# Patient Record
Sex: Male | Born: 1960 | Race: White | Hispanic: No | Marital: Married | Smoking: Never smoker
Health system: Southern US, Community
[De-identification: ages and names within clinical notes are randomized; demographics above are authoritative.]

## PROBLEM LIST (undated history)

## (undated) DIAGNOSIS — E119 Type 2 diabetes mellitus without complications: Secondary | ICD-10-CM

## (undated) DIAGNOSIS — M1611 Unilateral primary osteoarthritis, right hip: Secondary | ICD-10-CM

## (undated) DIAGNOSIS — N62 Hypertrophy of breast: Secondary | ICD-10-CM

## (undated) DIAGNOSIS — G8929 Other chronic pain: Secondary | ICD-10-CM

## (undated) DIAGNOSIS — I10 Essential (primary) hypertension: Secondary | ICD-10-CM

## (undated) DIAGNOSIS — M549 Dorsalgia, unspecified: Secondary | ICD-10-CM

## (undated) DIAGNOSIS — G4730 Sleep apnea, unspecified: Secondary | ICD-10-CM

## (undated) HISTORY — DX: Type 2 diabetes mellitus without complications: E11.9

## (undated) HISTORY — DX: Sleep apnea, unspecified: G47.30

## (undated) HISTORY — DX: Dorsalgia, unspecified: M54.9

## (undated) HISTORY — DX: Essential (primary) hypertension: I10

## (undated) HISTORY — DX: Hypertrophy of breast: N62

## (undated) HISTORY — DX: Other chronic pain: G89.29

---

## 2000-02-16 ENCOUNTER — Encounter: Payer: Self-pay | Admitting: Surgery

## 2000-02-16 ENCOUNTER — Encounter: Admission: RE | Admit: 2000-02-16 | Discharge: 2000-02-16 | Payer: Self-pay | Admitting: Surgery

## 2001-09-18 ENCOUNTER — Encounter: Admission: RE | Admit: 2001-09-18 | Discharge: 2001-09-18 | Payer: Self-pay | Admitting: Family Medicine

## 2001-09-18 ENCOUNTER — Encounter: Payer: Self-pay | Admitting: Family Medicine

## 2011-03-24 ENCOUNTER — Encounter: Payer: Self-pay | Admitting: Family Medicine

## 2011-03-24 ENCOUNTER — Ambulatory Visit (INDEPENDENT_AMBULATORY_CARE_PROVIDER_SITE_OTHER): Payer: 59 | Admitting: Family Medicine

## 2011-03-24 DIAGNOSIS — N62 Hypertrophy of breast: Secondary | ICD-10-CM

## 2011-03-24 DIAGNOSIS — R109 Unspecified abdominal pain: Secondary | ICD-10-CM

## 2011-03-24 DIAGNOSIS — R1030 Lower abdominal pain, unspecified: Secondary | ICD-10-CM | POA: Insufficient documentation

## 2011-03-24 NOTE — Assessment & Plan Note (Signed)
No tunnel vision or alarming sx.  Likely related to weight, d/w pt ZO:XWRUEA loss.

## 2011-03-24 NOTE — Patient Instructions (Signed)
Let me know if you pain increases or if you notice other changes in the meantime.  I would schedule a physical for the fall and I'll look over your records when they arrive.  Take care.  Glad to see you today.

## 2011-03-24 NOTE — Progress Notes (Signed)
Leg pain for weeks now.  Working about 70-80 hours a week, heavy lifting with R groin/leg pain.  Now with mass in R groin.  R knee was swollen after a big dog ran into him. Swelling went down in interval.   Inc in gynecomastia.  Bilateral, inc in last 2 months.  H/o prev that was similar and self resolved with weight loss.  Inc as his weight increased- this is similar to prev.   Meds, vitals, and allergies reviewed.   ROS: See HPI.  Otherwise, noncontributory.  Nad ncat Mmm rrr Prominent nipples with inc in tissue mass deep to nipple ctab abd soft, not ttp Testes bilaterally descended without nodularity, tenderness or masses. No scrotal masses or lesions. No hernia noted. Small mass noted in proximal r thigh, medially, minimally ttp. No erythema or skin changes.

## 2011-03-24 NOTE — Assessment & Plan Note (Signed)
Possible groin strain vs lipoma. Unlikely to be hernia.  Activity as tolerated and fu prn.  He agrees.  Anatomy d/w pt.

## 2011-04-04 ENCOUNTER — Encounter: Payer: Self-pay | Admitting: Family Medicine

## 2011-04-04 DIAGNOSIS — G4733 Obstructive sleep apnea (adult) (pediatric): Secondary | ICD-10-CM | POA: Insufficient documentation

## 2011-04-04 DIAGNOSIS — K760 Fatty (change of) liver, not elsewhere classified: Secondary | ICD-10-CM | POA: Insufficient documentation

## 2011-04-04 DIAGNOSIS — Z9989 Dependence on other enabling machines and devices: Secondary | ICD-10-CM | POA: Insufficient documentation

## 2011-04-04 DIAGNOSIS — M810 Age-related osteoporosis without current pathological fracture: Secondary | ICD-10-CM | POA: Insufficient documentation

## 2013-01-14 ENCOUNTER — Ambulatory Visit (INDEPENDENT_AMBULATORY_CARE_PROVIDER_SITE_OTHER): Payer: 59 | Admitting: Family Medicine

## 2013-01-14 ENCOUNTER — Encounter: Payer: Self-pay | Admitting: Family Medicine

## 2013-01-14 VITALS — BP 152/96 | HR 86 | Temp 98.0°F | Wt 265.0 lb

## 2013-01-14 DIAGNOSIS — M25569 Pain in unspecified knee: Secondary | ICD-10-CM | POA: Insufficient documentation

## 2013-01-14 DIAGNOSIS — M25561 Pain in right knee: Secondary | ICD-10-CM

## 2013-01-14 DIAGNOSIS — K4090 Unilateral inguinal hernia, without obstruction or gangrene, not specified as recurrent: Secondary | ICD-10-CM

## 2013-01-14 DIAGNOSIS — R1030 Lower abdominal pain, unspecified: Secondary | ICD-10-CM

## 2013-01-14 DIAGNOSIS — R109 Unspecified abdominal pain: Secondary | ICD-10-CM

## 2013-01-14 MED ORDER — TRAMADOL HCL 50 MG PO TABS
50.0000 mg | ORAL_TABLET | Freq: Three times a day (TID) | ORAL | Status: DC | PRN
Start: 2013-01-14 — End: 2013-03-15

## 2013-01-14 NOTE — Assessment & Plan Note (Signed)
Now with likely RIH.  Refer to gen surery.

## 2013-01-14 NOTE — Patient Instructions (Addendum)
See Shirlee Limerick about your referral before you leave today. Take the tramadol as needed for pain.  It can make you drowsy.  Glad to see you.

## 2013-01-14 NOTE — Assessment & Plan Note (Signed)
Tramadol in meantime and refer to ortho.  We don't have capacity for weight bearing films here.  Will defer imaging.  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

## 2013-01-14 NOTE — Progress Notes (Signed)
Prev with R groin pain but this is worse recently.  I didn't know if he had a hernia prev when I checked him >1 yea ago.  Pain with walking now. Not painful sitting or laying down.  No FCNAVD.  No abd pain.  No changes in stools.  No burning with urination. No new injury recently.    R knee pain continues.  Medial pain.  Crepitus noted by patient.  Pain walking.  Prev hit by a large dog >1 year ago.  Pain increased recently.   Meds, vitals, and allergies reviewed.   ROS: See HPI.  Otherwise, noncontributory.  nad Likely RIH noted, soft but ttp R knee with normal ROM but crepitus and medial joint line ttp ACL/MCL/LCL feel solid

## 2013-01-16 ENCOUNTER — Encounter (INDEPENDENT_AMBULATORY_CARE_PROVIDER_SITE_OTHER): Payer: Self-pay | Admitting: General Surgery

## 2013-01-16 ENCOUNTER — Ambulatory Visit (INDEPENDENT_AMBULATORY_CARE_PROVIDER_SITE_OTHER): Payer: BC Managed Care – PPO | Admitting: General Surgery

## 2013-01-16 VITALS — BP 138/94 | HR 89 | Temp 98.6°F | Ht 69.0 in | Wt 267.8 lb

## 2013-01-16 DIAGNOSIS — K4090 Unilateral inguinal hernia, without obstruction or gangrene, not specified as recurrent: Secondary | ICD-10-CM

## 2013-01-16 NOTE — Progress Notes (Signed)
Patient ID: Ivan Mcguire, male   DOB: 07-Apr-1961, 52 y.o.   MRN: [id number]  Chief Complaint  Patient presents with  . New Evaluation    eval RIH    HPI Ivan Mcguire is a 52 y.o. male.  This patient is referred by Dr. Para March for evaluation of right groin pain and an inguinal hernia. He says that he had some discomfort in his right groin and thigh for about one year now and was thought to have a lipoma in his upper leg because he felt some lumps in his groin. This seemed to improve but he has now noticed an another lump in his groin and he complains of "burning and pulling" pain in his right groin which radiates down to his legs. He says it always hurts when he first in the but that improves with more time. He is actually limping now. He does do a lot of lifting for his work. He says his bowels are normal and has no obstructive symptoms HPI  Past Medical History  Diagnosis Date  . HTN (hypertension)   . Sleep apnea   . Gynecomastia, male   . Chronic back pain     Past Surgical History  Procedure Laterality Date  . No past surgeries      denies surgical history    Family History  Problem Relation Age of Onset  . Angina Mother   . Stroke Father   . Dementia Father   . Diabetes Brother     Social History History  Substance Use Topics  . Smoking status: Never Smoker   . Smokeless tobacco: Not on file  . Alcohol Use: Yes     Comment: couple beers per month    No Known Allergies  Current Outpatient Prescriptions  Medication Sig Dispense Refill  . ibuprofen (ADVIL,MOTRIN) 200 MG tablet 800 mg every 8 (eight) hours as needed.        . traMADol (ULTRAM) 50 MG tablet Take 1 tablet (50 mg total) by mouth every 8 (eight) hours as needed for pain.  60 tablet  1   No current facility-administered medications for this visit.    Review of Systems Review of Systems All other review of systems negative or noncontributory except as stated in the HPI  Blood pressure 138/94,  pulse 89, temperature 98.6 F (37 C), temperature source Temporal, height 5\' 9"  (1.753 m), weight 267 lb 12.8 oz (121.473 kg), SpO2 97.00%.  Physical Exam Physical Exam Physical Exam  Vitals reviewed. Constitutional: He is oriented to person, place, and time. He appears well-developed and well-nourished. No distress.  HENT:  Head: Normocephalic and atraumatic.  Mouth/Throat: No oropharyngeal exudate.  Eyes: Conjunctivae and EOM are normal. Pupils are equal, round, and reactive to light. Right eye exhibits no discharge. Left eye exhibits no discharge. No scleral icterus.  Neck: Normal range of motion. No tracheal deviation present.  Cardiovascular: Normal rate, regular rhythm and normal heart sounds.   Pulmonary/Chest: Effort normal and breath sounds normal. No stridor. No respiratory distress. He has no wheezes. He has no rales. He exhibits no tenderness.  Abdominal: Soft. Bowel sounds are normal. He exhibits no distension and no mass. There is no tenderness. There is no rebound and no guarding. small and reducible inguinal hernias bilaterally Musculoskeletal: Normal range of motion. He exhibits no edema and no tenderness.  Neurological: He is alert and oriented to person, place, and time.  Skin: Skin is warm and dry. No rash noted. He is not diaphoretic. No  erythema. No pallor.  Psychiatric: He has a normal mood and affect. His behavior is normal. Judgment and thought content normal.    Data Reviewed   Assessment    Bilateral inguinal hernias-reducible I think that he does have small and reducible bilateral inguinal hernias but I am not certain that this is the cause of his hip discomfort and groin pain. He is scheduled to see the orthopedic doctor next week in think that this is appropriate. I did offer open or laparoscopic inguinal hernia repair and I would probably recommend microscopic bilateral inguinal hernia repair for this case, however, think it is appropriate to treat his knee  and see if his groin pain improves as this may all be musculoskeletal in origin and these hernias maybe incidental. He will go ahead and follow up with orthopedic surgery and he will followup with me after this 2 discuss possible microscopic bilateral inguinal hernia repairs    Plan    Orthopedic surgery referral and followup after this to discuss bilateral inguinal hernia repair        Ivan Mcguire DAVID 01/16/2013, 9:22 AM

## 2013-03-15 ENCOUNTER — Encounter (HOSPITAL_COMMUNITY): Payer: Self-pay | Admitting: Pharmacy Technician

## 2013-03-16 NOTE — Pre-Procedure Instructions (Signed)
Ivan Mcguire  03/16/2013   Your procedure is scheduled on:  Tues, June 3 @ 10:30 AM  Report to [facility] at 8:30 AM.  Call this number if you have problems the morning of surgery: [phone]   Remember:   Do not eat food or drink liquids after midnight.   Take these medicines the morning of surgery with A SIP OF WATER: Tamadol(Ultram) if needed               Stop taking your Ibuprofen. No Goody's,BC's,Aleve,Fish Oil,or any Herbal Medications   Do not wear jewelry.  Do not wear lotions, powders, or colognes. You may wear deodorant.  Men may shave face and neck.  Do not bring valuables to the hospital.  Contacts, dentures or bridgework may not be worn into surgery.  Leave suitcase in the car. After surgery it may be brought to your room.  For patients admitted to the hospital, checkout time is 11:00 AM the day of  discharge.   Patients discharged the day of surgery will not be allowed to drive  home.    Special Instructions: Shower using CHG 2 nights before surgery and the night before surgery.  If you shower the day of surgery use CHG.  Use special wash - you have one bottle of CHG for all showers.  You should use approximately 1/3 of the bottle for each shower.   Please read over the following fact sheets that you were given: Pain Booklet, Coughing and Deep Breathing, Blood Transfusion Information, Total Joint Packet, MRSA Information and Surgical Site Infection Prevention

## 2013-03-19 ENCOUNTER — Encounter (HOSPITAL_COMMUNITY)
Admission: RE | Admit: 2013-03-19 | Discharge: 2013-03-19 | Disposition: A | Discharge status: Home / Self Care | Payer: BC Managed Care – PPO | Source: Ambulatory Visit | Attending: Orthopedic Surgery | Admitting: Orthopedic Surgery

## 2013-03-19 ENCOUNTER — Other Ambulatory Visit: Payer: Self-pay | Admitting: Orthopedic Surgery

## 2013-03-19 ENCOUNTER — Encounter (HOSPITAL_COMMUNITY): Payer: Self-pay

## 2013-03-19 DIAGNOSIS — Z01812 Encounter for preprocedural laboratory examination: Secondary | ICD-10-CM | POA: Insufficient documentation

## 2013-03-19 DIAGNOSIS — Z01818 Encounter for other preprocedural examination: Secondary | ICD-10-CM | POA: Insufficient documentation

## 2013-03-19 DIAGNOSIS — Z01811 Encounter for preprocedural respiratory examination: Secondary | ICD-10-CM | POA: Insufficient documentation

## 2013-03-19 DIAGNOSIS — Z01810 Encounter for preprocedural cardiovascular examination: Secondary | ICD-10-CM | POA: Insufficient documentation

## 2013-03-19 LAB — CBC
Hemoglobin: 15.2 g/dL (ref 13.0–17.0)
MCH: 30.4 pg (ref 26.0–34.0)
MCV: 88.6 fL (ref 78.0–100.0)
Platelets: 198 10*3/uL (ref 150–400)
RBC: 5 MIL/uL (ref 4.22–5.81)

## 2013-03-19 LAB — BASIC METABOLIC PANEL
BUN: 13 mg/dL (ref 6–23)
Calcium: 9.5 mg/dL (ref 8.4–10.5)
Chloride: 107 mEq/L (ref 96–112)
Creatinine, Ser: 0.73 mg/dL (ref 0.50–1.35)
GFR calc Af Amer: >90 mL/min (ref >90)

## 2013-03-19 LAB — ABO/RH: ABO/RH(D): O POS

## 2013-03-19 LAB — TYPE AND SCREEN
ABO/RH(D): O POS
Antibody Screen: NEGATIVE

## 2013-03-19 NOTE — Progress Notes (Signed)
Pt to see Dr Charna Elizabeth office to day for cardiac clearance.

## 2013-03-20 ENCOUNTER — Ambulatory Visit (INDEPENDENT_AMBULATORY_CARE_PROVIDER_SITE_OTHER): Payer: BC Managed Care – PPO | Admitting: Family Medicine

## 2013-03-20 ENCOUNTER — Encounter: Payer: Self-pay | Admitting: Family Medicine

## 2013-03-20 VITALS — BP 142/82 | HR 96 | Temp 98.2°F | Wt 265.5 lb

## 2013-03-20 DIAGNOSIS — G4733 Obstructive sleep apnea (adult) (pediatric): Secondary | ICD-10-CM

## 2013-03-20 DIAGNOSIS — R05 Cough: Secondary | ICD-10-CM | POA: Insufficient documentation

## 2013-03-20 DIAGNOSIS — R059 Cough, unspecified: Secondary | ICD-10-CM | POA: Insufficient documentation

## 2013-03-20 DIAGNOSIS — Z01818 Encounter for other preprocedural examination: Secondary | ICD-10-CM | POA: Insufficient documentation

## 2013-03-20 MED ORDER — BENZONATATE 100 MG PO CAPS: 100.0000 mg | ORAL_CAPSULE | Freq: Two times a day (BID) | ORAL | Status: DC | PRN

## 2013-03-20 NOTE — Patient Instructions (Addendum)
Good to meet you today! Good luck on upcoming surgery. I will fax today's note to Dr. Dion Saucier. Next blood work consider getting cholesterol and sugar checked.

## 2013-03-20 NOTE — Assessment & Plan Note (Signed)
Mild, post viral. Treat with tessalon perls.  Lungs clear today.

## 2013-03-20 NOTE — Assessment & Plan Note (Signed)
Intermediate risk procedure.  Cardiac risk factors include obesity and OSA (which seems well controlled currently), however no significant cardiac or pulmonary symptoms endorsed. Recommend anticoagulation per ortho, recommend CPAP perioperatively. Hopeful to improve mobility to be able to establish exercise regimen after surgery.  Discussed risks vs benefits of upcoming surgery.  Discussed surgery not without its risks, but I expect him to do well. Will fax today's note to Dr. Dion Saucier. Reviewed all blood work, imaging and testing done yesterday at hospital.  No need for further eval.

## 2013-03-20 NOTE — Progress Notes (Signed)
Subjective:    Patient ID: Ivan Mcguire, male    DOB: 1961/05/01, 52 y.o.   MRN: [id number]  HPI CC: preop clearance  [name] is a pleasant 52 yo patient of Dr. Lianne Bushy new to me who presents today for preop clearance of upcoming R total hip replacement on 03/26/2013.  Noted right leg pain for years now, recent eval by ortho with R knee injection and revealing significant arthritis in right hip. 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  Active at work, lots of walking.  No other exercise.  Limited lower extremity cardio 2/2 hip pain.  Limps at work 2/2 R hip pain.  He did have preop EKG, CXR and blood yesterday at hospital work which was all reviewed. CXR - no active disease EKG - NSR rate 80, normal axis, intervals, no acute ST/T changes Blood work - normal CBC, BMP except for glu 144, PT/INR, MRSA screen negative.  Was not fasting prior to blood work (had coffee with sugar prior to blood work).  H/o OSA on CPAP and uses nightly.  Dx at age 44 yo.  Has old mask, would like script for now supplies.   H/o borderline HTN, off meds.  No known allergies to medicines. Only on tramadol 50mg  prn pain.  Lingering cough - from viral URI 3-4 wks ago.  Requests med for this.    Wt Readings from Last 3 Encounters:  03/20/13 265 lb 8 oz (120.43 kg)  03/19/13 267 lb 14.4 oz (121.519 kg)  01/16/13 267 lb 12.8 oz (121.473 kg)   Body mass index is 39.19 kg/(m^2).  Medications and allergies reviewed and updated in chart.  Past histories reviewed and updated if relevant as below. Patient Active Problem List   Diagnosis Date Noted  . Knee pain 01/14/2013  . OSA on CPAP 04/04/2011  . Osteoporosis 04/04/2011  . Fatty liver 04/04/2011  . Gynecomastia 03/24/2011  . Groin pain, right 03/24/2011   Past Medical History  Diagnosis Date  . Gynecomastia, male   . Chronic back pain   . Sleep apnea     cpap    last sleep study > 20 yrs  . HTN (hypertension)     no med  dr Desma Maxim    Past Surgical History  Procedure Laterality Date  . No past surgeries      denies surgical history   History  Substance Use Topics  . Smoking status: Never Smoker   . Smokeless tobacco: Not on file  . Alcohol Use: Yes     Comment: couple beers per month   Family History  Problem Relation Age of Onset  . Angina Mother   . Stroke Father   . Dementia Father   . Diabetes Brother    No Known Allergies Current Outpatient Prescriptions on File Prior to Visit  Medication Sig Dispense Refill  . traMADol (ULTRAM) 50 MG tablet Take 50 mg by mouth 2 (two) times daily as needed.       Marland Kitchen ibuprofen (ADVIL,MOTRIN) 200 MG tablet Take 800 mg by mouth every 6 (six) hours as needed for pain.       . Multiple Vitamin (MULTIVITAMIN WITH MINERALS) TABS Take 1 tablet by mouth daily.       No current facility-administered medications on file prior to visit.     Review of Systems No recent headaches, vision changes, dizziness, palpitations, chest pain/tightness, dyspnea at rest or with exertion, PNdyspnea, or leg swelling.  Occasional dependent ankle swelling after  long day at work.     Objective:   Physical Exam  Nursing note and vitals reviewed. Constitutional: He appears well-developed and well-nourished. No distress.  HENT:  Head: Normocephalic and atraumatic.  Mouth/Throat: Oropharynx is clear and moist.  Eyes: Conjunctivae and EOM are normal. Pupils are equal, round, and reactive to light.  Neck: Normal range of motion. Neck supple. Carotid bruit is not present. No thyromegaly present.  Cardiovascular: Normal rate, regular rhythm, normal heart sounds and intact distal pulses.   No murmur heard. Pulses:      Radial pulses are 2+ on the right side, and 2+ on the left side.  Pulmonary/Chest: Effort normal and breath sounds normal. No respiratory distress. He has no wheezes. He has no rales.  Abdominal: Soft. Bowel sounds are normal. He exhibits no distension and no mass. There is no  tenderness. There is no rebound and no guarding.  Musculoskeletal: Normal range of motion. He exhibits no edema.  Lymphadenopathy:    He has no cervical adenopathy.  Neurological: He is alert.  CN grossly intact, station and gait intact       Assessment & Plan:

## 2013-03-20 NOTE — Assessment & Plan Note (Signed)
Script for new CPAP mask/tubing/supplies provided today. Recommended CPAP perioperatively as he has been doing at home.

## 2013-03-25 MED ORDER — DEXTROSE 5 % IV SOLN
3.0000 g | INTRAVENOUS | Status: AC
  Administered 2013-03-26: 3 g via INTRAVENOUS
  Filled 2013-03-25: qty 3000

## 2013-03-26 ENCOUNTER — Encounter (HOSPITAL_COMMUNITY): Payer: Self-pay | Admitting: Anesthesiology

## 2013-03-26 ENCOUNTER — Encounter (HOSPITAL_COMMUNITY): Payer: Self-pay | Admitting: *Deleted

## 2013-03-26 ENCOUNTER — Inpatient Hospital Stay (HOSPITAL_COMMUNITY): Payer: BC Managed Care – PPO

## 2013-03-26 ENCOUNTER — Ambulatory Visit (HOSPITAL_COMMUNITY): Payer: BC Managed Care – PPO | Admitting: Anesthesiology

## 2013-03-26 ENCOUNTER — Encounter (HOSPITAL_COMMUNITY)
Admission: RE | Disposition: A | Discharge status: Home / Self Care | Payer: Self-pay | Source: Ambulatory Visit | Attending: Orthopedic Surgery

## 2013-03-26 ENCOUNTER — Inpatient Hospital Stay (HOSPITAL_COMMUNITY)
Admission: RE | Admit: 2013-03-26 | Discharge: 2013-03-28 | DRG: 818 | Disposition: A | Discharge status: Home / Self Care | Payer: BC Managed Care – PPO | Source: Ambulatory Visit | Attending: Orthopedic Surgery | Admitting: Orthopedic Surgery

## 2013-03-26 DIAGNOSIS — N62 Hypertrophy of breast: Secondary | ICD-10-CM | POA: Diagnosis present

## 2013-03-26 DIAGNOSIS — M169 Osteoarthritis of hip, unspecified: Principal | ICD-10-CM | POA: Diagnosis present

## 2013-03-26 DIAGNOSIS — I10 Essential (primary) hypertension: Secondary | ICD-10-CM | POA: Diagnosis present

## 2013-03-26 DIAGNOSIS — Z6839 Body mass index (BMI) 39.0-39.9, adult: Secondary | ICD-10-CM

## 2013-03-26 DIAGNOSIS — G4730 Sleep apnea, unspecified: Secondary | ICD-10-CM | POA: Diagnosis present

## 2013-03-26 DIAGNOSIS — M1610 Unilateral primary osteoarthritis, unspecified hip: Principal | ICD-10-CM | POA: Diagnosis present

## 2013-03-26 DIAGNOSIS — M1611 Unilateral primary osteoarthritis, right hip: Secondary | ICD-10-CM | POA: Diagnosis present

## 2013-03-26 DIAGNOSIS — M549 Dorsalgia, unspecified: Secondary | ICD-10-CM | POA: Diagnosis present

## 2013-03-26 DIAGNOSIS — G8929 Other chronic pain: Secondary | ICD-10-CM | POA: Diagnosis present

## 2013-03-26 HISTORY — PX: TOTAL HIP ARTHROPLASTY: SHX124

## 2013-03-26 HISTORY — DX: Unilateral primary osteoarthritis, right hip: M16.11

## 2013-03-26 LAB — BASIC METABOLIC PANEL
Calcium: 9 mg/dL (ref 8.4–10.5)
GFR calc Af Amer: >90 mL/min (ref >90)
GFR calc non Af Amer: >90 mL/min (ref >90)
Potassium: 3.8 mEq/L (ref 3.5–5.1)
Sodium: 138 mEq/L (ref 135–145)

## 2013-03-26 LAB — CBC
Hemoglobin: 12.8 g/dL — ABNORMAL LOW (ref 13.0–17.0)
MCHC: 34.8 g/dL (ref 30.0–36.0)
Platelets: 199 10*3/uL (ref 150–400)
RDW: 13.8 % (ref 11.5–15.5)

## 2013-03-26 SURGERY — ARTHROPLASTY, HIP, TOTAL,POSTERIOR APPROACH
Anesthesia: General | Site: Hip | Laterality: Right | Wound class: Clean

## 2013-03-26 MED ORDER — VECURONIUM BROMIDE 10 MG IV SOLR
INTRAVENOUS | Status: DC | PRN
  Administered 2013-03-26 (×3): 2 mg via INTRAVENOUS

## 2013-03-26 MED ORDER — BUPIVACAINE HCL (PF) 0.25 % IJ SOLN
INTRAMUSCULAR | Status: AC
  Filled 2013-03-26: qty 30

## 2013-03-26 MED ORDER — METOCLOPRAMIDE HCL 10 MG PO TABS: 5.0000 mg | ORAL_TABLET | Freq: Three times a day (TID) | ORAL | Status: DC | PRN

## 2013-03-26 MED ORDER — OXYCODONE-ACETAMINOPHEN 10-325 MG PO TABS: 1.0000 | ORAL_TABLET | Freq: Four times a day (QID) | ORAL | Status: DC | PRN

## 2013-03-26 MED ORDER — ONDANSETRON HCL 4 MG/2ML IJ SOLN
4.0000 mg | Freq: Four times a day (QID) | INTRAMUSCULAR | Status: DC | PRN
  Administered 2013-03-26 – 2013-03-27 (×2): 4 mg via INTRAVENOUS
  Filled 2013-03-26 (×2): qty 2

## 2013-03-26 MED ORDER — SENNA 8.6 MG PO TABS
1.0000 | ORAL_TABLET | Freq: Two times a day (BID) | ORAL | Status: DC
  Administered 2013-03-26 – 2013-03-28 (×4): 8.6 mg via ORAL
  Filled 2013-03-26 (×7): qty 1

## 2013-03-26 MED ORDER — SODIUM CHLORIDE 0.9 % IR SOLN
Status: DC | PRN
  Administered 2013-03-26: 1000 mL

## 2013-03-26 MED ORDER — MIDAZOLAM HCL 5 MG/5ML IJ SOLN
INTRAMUSCULAR | Status: DC | PRN
  Administered 2013-03-26: 1 mg via INTRAVENOUS

## 2013-03-26 MED ORDER — ADULT MULTIVITAMIN W/MINERALS CH
1.0000 | ORAL_TABLET | Freq: Every day | ORAL | Status: DC
  Administered 2013-03-27 – 2013-03-28 (×2): 1 via ORAL
  Filled 2013-03-26 (×3): qty 1

## 2013-03-26 MED ORDER — ONDANSETRON HCL 4 MG/2ML IJ SOLN: 4.0000 mg | Freq: Once | INTRAMUSCULAR | Status: DC | PRN

## 2013-03-26 MED ORDER — BUPIVACAINE HCL (PF) 0.25 % IJ SOLN
INTRAMUSCULAR | Status: DC | PRN
  Administered 2013-03-26: 30 mL

## 2013-03-26 MED ORDER — MENTHOL 3 MG MT LOZG: 1.0000 | LOZENGE | OROMUCOSAL | Status: DC | PRN

## 2013-03-26 MED ORDER — NEOSTIGMINE METHYLSULFATE 1 MG/ML IJ SOLN
INTRAMUSCULAR | Status: DC | PRN
  Administered 2013-03-26: 3 mg via INTRAVENOUS

## 2013-03-26 MED ORDER — METHOCARBAMOL 500 MG PO TABS: 500.0000 mg | ORAL_TABLET | Freq: Four times a day (QID) | ORAL | Status: DC

## 2013-03-26 MED ORDER — KETOROLAC TROMETHAMINE 15 MG/ML IJ SOLN
15.0000 mg | Freq: Four times a day (QID) | INTRAMUSCULAR | Status: AC
  Administered 2013-03-26 – 2013-03-27 (×3): 15 mg via INTRAVENOUS
  Filled 2013-03-26 (×3): qty 1

## 2013-03-26 MED ORDER — ACETAMINOPHEN 325 MG PO TABS: 650.0000 mg | ORAL_TABLET | Freq: Four times a day (QID) | ORAL | Status: DC | PRN

## 2013-03-26 MED ORDER — SENNA-DOCUSATE SODIUM 8.6-50 MG PO TABS: 2.0000 | ORAL_TABLET | Freq: Every day | ORAL | Status: DC

## 2013-03-26 MED ORDER — BENZONATATE 100 MG PO CAPS
100.0000 mg | ORAL_CAPSULE | Freq: Two times a day (BID) | ORAL | Status: DC | PRN
  Filled 2013-03-26: qty 1

## 2013-03-26 MED ORDER — IBUPROFEN 800 MG PO TABS
800.0000 mg | ORAL_TABLET | Freq: Four times a day (QID) | ORAL | Status: DC | PRN
  Filled 2013-03-26: qty 1

## 2013-03-26 MED ORDER — METOCLOPRAMIDE HCL 5 MG/ML IJ SOLN: 5.0000 mg | Freq: Three times a day (TID) | INTRAMUSCULAR | Status: DC | PRN

## 2013-03-26 MED ORDER — METHOCARBAMOL 500 MG PO TABS
500.0000 mg | ORAL_TABLET | Freq: Four times a day (QID) | ORAL | Status: DC | PRN
  Administered 2013-03-26 – 2013-03-28 (×5): 500 mg via ORAL
  Filled 2013-03-26 (×4): qty 1

## 2013-03-26 MED ORDER — DEXAMETHASONE SODIUM PHOSPHATE 4 MG/ML IJ SOLN
INTRAMUSCULAR | Status: DC | PRN
  Administered 2013-03-26: 4 mg via INTRAVENOUS

## 2013-03-26 MED ORDER — METHOCARBAMOL 500 MG PO TABS
ORAL_TABLET | ORAL | Status: AC
  Filled 2013-03-26: qty 1

## 2013-03-26 MED ORDER — PROMETHAZINE HCL 25 MG PO TABS: 25.0000 mg | ORAL_TABLET | Freq: Four times a day (QID) | ORAL | Status: DC | PRN

## 2013-03-26 MED ORDER — POLYETHYLENE GLYCOL 3350 17 G PO PACK: 17.0000 g | PACK | Freq: Every day | ORAL | Status: DC | PRN

## 2013-03-26 MED ORDER — HYDROMORPHONE HCL PF 1 MG/ML IJ SOLN: 1.0000 mg | INTRAMUSCULAR | Status: DC | PRN

## 2013-03-26 MED ORDER — GLYCOPYRROLATE 0.2 MG/ML IJ SOLN
INTRAMUSCULAR | Status: DC | PRN
  Administered 2013-03-26: .4 mg via INTRAVENOUS
  Administered 2013-03-26: .2 mg via INTRAVENOUS

## 2013-03-26 MED ORDER — POTASSIUM CHLORIDE IN NACL 20-0.45 MEQ/L-% IV SOLN
INTRAVENOUS | Status: DC
  Administered 2013-03-26: 18:00:00 via INTRAVENOUS
  Filled 2013-03-26 (×5): qty 1000

## 2013-03-26 MED ORDER — ROCURONIUM BROMIDE 100 MG/10ML IV SOLN
INTRAVENOUS | Status: DC | PRN
  Administered 2013-03-26: 50 mg via INTRAVENOUS

## 2013-03-26 MED ORDER — SUCCINYLCHOLINE CHLORIDE 20 MG/ML IJ SOLN
INTRAMUSCULAR | Status: DC | PRN
  Administered 2013-03-26: 120 mg via INTRAVENOUS

## 2013-03-26 MED ORDER — CEFAZOLIN SODIUM-DEXTROSE 2-3 GM-% IV SOLR
2.0000 g | Freq: Four times a day (QID) | INTRAVENOUS | Status: AC
  Administered 2013-03-26 (×2): 2 g via INTRAVENOUS
  Filled 2013-03-26 (×3): qty 50

## 2013-03-26 MED ORDER — LACTATED RINGERS IV SOLN
INTRAVENOUS | Status: DC
  Administered 2013-03-26 (×2): via INTRAVENOUS

## 2013-03-26 MED ORDER — OXYCODONE HCL 5 MG PO TABS
5.0000 mg | ORAL_TABLET | ORAL | Status: DC | PRN
  Administered 2013-03-26 – 2013-03-27 (×4): 10 mg via ORAL
  Administered 2013-03-27: 5 mg via ORAL
  Administered 2013-03-27 – 2013-03-28 (×2): 10 mg via ORAL
  Administered 2013-03-28: 5 mg via ORAL
  Administered 2013-03-28: 10 mg via ORAL
  Filled 2013-03-26: qty 2
  Filled 2013-03-26 (×2): qty 1
  Filled 2013-03-26 (×6): qty 2

## 2013-03-26 MED ORDER — ALBUMIN HUMAN 5 % IV SOLN
INTRAVENOUS | Status: DC | PRN
  Administered 2013-03-26 (×2): via INTRAVENOUS

## 2013-03-26 MED ORDER — LIDOCAINE HCL (CARDIAC) 20 MG/ML IV SOLN
INTRAVENOUS | Status: DC | PRN
  Administered 2013-03-26: 80 mg via INTRAVENOUS

## 2013-03-26 MED ORDER — ALUM & MAG HYDROXIDE-SIMETH 200-200-20 MG/5ML PO SUSP
30.0000 mL | ORAL | Status: DC | PRN
  Administered 2013-03-27: 30 mL via ORAL
  Filled 2013-03-26: qty 30

## 2013-03-26 MED ORDER — FENTANYL CITRATE 0.05 MG/ML IJ SOLN
INTRAMUSCULAR | Status: DC | PRN
  Administered 2013-03-26: 250 ug via INTRAVENOUS
  Administered 2013-03-26: 100 ug via INTRAVENOUS

## 2013-03-26 MED ORDER — KETOROLAC TROMETHAMINE 30 MG/ML IJ SOLN
INTRAMUSCULAR | Status: AC
  Filled 2013-03-26: qty 1

## 2013-03-26 MED ORDER — ENOXAPARIN SODIUM 40 MG/0.4ML ~~LOC~~ SOLN
40.0000 mg | SUBCUTANEOUS | Status: DC
  Administered 2013-03-27 – 2013-03-28 (×2): 40 mg via SUBCUTANEOUS
  Filled 2013-03-26 (×4): qty 0.4

## 2013-03-26 MED ORDER — ONDANSETRON HCL 4 MG/2ML IJ SOLN
INTRAMUSCULAR | Status: DC | PRN
  Administered 2013-03-26: 4 mg via INTRAVENOUS

## 2013-03-26 MED ORDER — METHOCARBAMOL 100 MG/ML IJ SOLN
500.0000 mg | Freq: Four times a day (QID) | INTRAVENOUS | Status: DC | PRN
  Filled 2013-03-26: qty 5

## 2013-03-26 MED ORDER — KETOROLAC TROMETHAMINE 15 MG/ML IJ SOLN
15.0000 mg | Freq: Four times a day (QID) | INTRAMUSCULAR | Status: DC
  Administered 2013-03-26: 15 mg via INTRAVENOUS

## 2013-03-26 MED ORDER — ONDANSETRON HCL 4 MG PO TABS
4.0000 mg | ORAL_TABLET | Freq: Four times a day (QID) | ORAL | Status: DC | PRN
  Administered 2013-03-27: 4 mg via ORAL
  Filled 2013-03-26: qty 1

## 2013-03-26 MED ORDER — BISACODYL 10 MG RE SUPP: 10.0000 mg | Freq: Every day | RECTAL | Status: DC | PRN

## 2013-03-26 MED ORDER — PHENOL 1.4 % MT LIQD: 1.0000 | OROMUCOSAL | Status: DC | PRN

## 2013-03-26 MED ORDER — OXYCODONE HCL 5 MG PO TABS
ORAL_TABLET | ORAL | Status: AC
  Administered 2013-03-26: 10 mg via ORAL
  Filled 2013-03-26: qty 2

## 2013-03-26 MED ORDER — DOCUSATE SODIUM 100 MG PO CAPS
100.0000 mg | ORAL_CAPSULE | Freq: Two times a day (BID) | ORAL | Status: DC
  Administered 2013-03-26 – 2013-03-28 (×4): 100 mg via ORAL
  Filled 2013-03-26 (×5): qty 1

## 2013-03-26 MED ORDER — PROPOFOL 10 MG/ML IV BOLUS
INTRAVENOUS | Status: DC | PRN
  Administered 2013-03-26: 300 mg via INTRAVENOUS

## 2013-03-26 MED ORDER — ACETAMINOPHEN 650 MG RE SUPP: 650.0000 mg | Freq: Four times a day (QID) | RECTAL | Status: DC | PRN

## 2013-03-26 MED ORDER — HYDROMORPHONE HCL PF 1 MG/ML IJ SOLN: 0.2500 mg | INTRAMUSCULAR | Status: DC | PRN

## 2013-03-26 MED ORDER — DIPHENHYDRAMINE HCL 12.5 MG/5ML PO ELIX: 12.5000 mg | ORAL_SOLUTION | ORAL | Status: DC | PRN

## 2013-03-26 SURGICAL SUPPLY — 61 items
APL SKNCLS STERI-STRIP NONHPOA (GAUZE/BANDAGES/DRESSINGS) ×1
BENZOIN TINCTURE PRP APPL 2/3 (GAUZE/BANDAGES/DRESSINGS) ×2 IMPLANT
BLADE SAW SAG 73X25 THK (BLADE) ×1
BLADE SAW SGTL 73X25 THK (BLADE) ×1 IMPLANT
BRUSH FEMORAL CANAL (MISCELLANEOUS) IMPLANT
CAPT HIP PF MOP ×1 IMPLANT
CLOTH BEACON ORANGE TIMEOUT ST (SAFETY) ×2 IMPLANT
COVER BACK TABLE 24X17X13 BIG (DRAPES) IMPLANT
COVER SURGICAL LIGHT HANDLE (MISCELLANEOUS) ×2 IMPLANT
DRAPE INCISE IOBAN 66X45 STRL (DRAPES) IMPLANT
DRAPE ORTHO SPLIT 77X108 STRL (DRAPES) ×4
DRAPE PROXIMA HALF (DRAPES) ×2 IMPLANT
DRAPE SURG ORHT 6 SPLT 77X108 (DRAPES) ×2 IMPLANT
DRAPE U-SHAPE 47X51 STRL (DRAPES) ×2 IMPLANT
DRILL BIT 5/64 (BIT) ×2 IMPLANT
DRSG MEPILEX BORDER 4X12 (GAUZE/BANDAGES/DRESSINGS) ×1 IMPLANT
DRSG MEPILEX BORDER 4X8 (GAUZE/BANDAGES/DRESSINGS) ×1 IMPLANT
DRSG PAD ABDOMINAL 8X10 ST (GAUZE/BANDAGES/DRESSINGS) ×4 IMPLANT
DURAPREP 26ML APPLICATOR (WOUND CARE) ×3 IMPLANT
ELECT BLADE 4.0 EZ CLEAN MEGAD (MISCELLANEOUS) ×2
ELECT CAUTERY BLADE 6.4 (BLADE) ×2 IMPLANT
ELECT REM PT RETURN 9FT ADLT (ELECTROSURGICAL) ×2
ELECTRODE BLDE 4.0 EZ CLN MEGD (MISCELLANEOUS) IMPLANT
ELECTRODE REM PT RTRN 9FT ADLT (ELECTROSURGICAL) ×1 IMPLANT
GLOVE BIOGEL PI ORTHO PRO SZ8 (GLOVE) ×1
GLOVE ORTHO TXT STRL SZ7.5 (GLOVE) ×2 IMPLANT
GLOVE PI ORTHO PRO STRL SZ8 (GLOVE) ×1 IMPLANT
GLOVE SURG ORTHO 8.0 STRL STRW (GLOVE) ×4 IMPLANT
GOWN STRL NON-REIN LRG LVL3 (GOWN DISPOSABLE) IMPLANT
HANDPIECE INTERPULSE COAX TIP (DISPOSABLE)
HOOD PEEL AWAY FACE SHEILD DIS (HOOD) ×5 IMPLANT
KIT BASIN OR (CUSTOM PROCEDURE TRAY) ×2 IMPLANT
KIT ROOM TURNOVER OR (KITS) ×2 IMPLANT
MANIFOLD NEPTUNE II (INSTRUMENTS) ×2 IMPLANT
NDL HYPO 25GX1X1/2 BEV (NEEDLE) ×1 IMPLANT
NEEDLE HYPO 25GX1X1/2 BEV (NEEDLE) ×2 IMPLANT
NS IRRIG 1000ML POUR BTL (IV SOLUTION) ×2 IMPLANT
PACK TOTAL JOINT (CUSTOM PROCEDURE TRAY) ×2 IMPLANT
PAD ARMBOARD 7.5X6 YLW CONV (MISCELLANEOUS) ×4 IMPLANT
PILLOW ABDUCTION HIP (SOFTGOODS) ×2 IMPLANT
PRESSURIZER FEMORAL UNIV (MISCELLANEOUS) IMPLANT
RETRIEVER SUT HEWSON (MISCELLANEOUS) ×2 IMPLANT
SET HNDPC FAN SPRY TIP SCT (DISPOSABLE) IMPLANT
SPONGE GAUZE 4X4 12PLY (GAUZE/BANDAGES/DRESSINGS) ×2 IMPLANT
SPONGE LAP 4X18 X RAY DECT (DISPOSABLE) IMPLANT
STRIP CLOSURE SKIN 1/2X4 (GAUZE/BANDAGES/DRESSINGS) ×4 IMPLANT
SUCTION FRAZIER TIP 10 FR DISP (SUCTIONS) ×2 IMPLANT
SUT FIBERWIRE #2 38 REV NDL BL (SUTURE) ×6
SUT MNCRL AB 4-0 PS2 18 (SUTURE) IMPLANT
SUT VIC AB 0 CT1 27 (SUTURE) ×4
SUT VIC AB 0 CT1 27XBRD ANBCTR (SUTURE) ×1 IMPLANT
SUT VIC AB 2-0 CT1 27 (SUTURE) ×2
SUT VIC AB 2-0 CT1 TAPERPNT 27 (SUTURE) ×1 IMPLANT
SUT VIC AB 3-0 SH 18 (SUTURE) ×2 IMPLANT
SUTURE FIBERWR#2 38 REV NDL BL (SUTURE) ×3 IMPLANT
SYR CONTROL 10ML LL (SYRINGE) ×2 IMPLANT
TOWEL OR 17X24 6PK STRL BLUE (TOWEL DISPOSABLE) ×2 IMPLANT
TOWEL OR 17X26 10 PK STRL BLUE (TOWEL DISPOSABLE) ×2 IMPLANT
TOWER CARTRIDGE SMART MIX (DISPOSABLE) IMPLANT
TRAY FOLEY CATH 14FR (SET/KITS/TRAYS/PACK) ×2 IMPLANT
WATER STERILE IRR 1000ML POUR (IV SOLUTION) ×8 IMPLANT

## 2013-03-26 NOTE — H&P (Signed)
  PREOPERATIVE H&P  Chief Complaint: DJD RIGHT HIP  HPI: Ivan Mcguire is a 52 y.o. male who presents for preoperative history and physical with a diagnosis of DJD RIGHT HIP. Symptoms are rated as moderate to severe, and have been worsening.  This is significantly impairing activities of daily living.  He has elected for surgical management. He has failed activity modification, use a cane, anti-inflammatories, and pain medications.  Past Medical History  Diagnosis Date  . Gynecomastia, male   . Chronic back pain   . Sleep apnea     cpap    last sleep study > 20 yrs  . HTN (hypertension)     no med  dr Desma Maxim   Past Surgical History  Procedure Laterality Date  . No past surgeries      denies surgical history   History   Social History  . Marital Status: Married    Spouse Name: N/A    Number of Children: N/A  . Years of Education: N/A   Social History Main Topics  . Smoking status: Never Smoker   . Smokeless tobacco: None  . Alcohol Use: Yes     Comment: couple beers per month  . Drug Use: No  . Sexually Active: None   Other Topics Concern  . None   Social History Narrative  . None   Family History  Problem Relation Age of Onset  . Angina Mother   . CAD Father     cerebral aneurysm  . Dementia Father 59  . Diabetes Brother   . Cancer Maternal Aunt     pancreas?   No Known Allergies Prior to Admission medications   Medication Sig Start Date End Date Taking? Authorizing Provider  benzonatate (TESSALON) 100 MG capsule Take 1 capsule (100 mg total) by mouth 2 (two) times daily as needed for cough. 03/20/13  Yes Eustaquio Boyden, MD  ibuprofen (ADVIL,MOTRIN) 200 MG tablet Take 800 mg by mouth every 6 (six) hours as needed for pain.    Yes Historical Provider, MD  Multiple Vitamin (MULTIVITAMIN WITH MINERALS) TABS Take 1 tablet by mouth daily.   Yes Historical Provider, MD  traMADol (ULTRAM) 50 MG tablet Take 50 mg by mouth 2 (two) times daily as needed.   01/14/13  Yes Historical Provider, MD     Positive ROS: All other systems have been reviewed and were otherwise negative with the exception of those mentioned in the HPI and as above.  Physical Exam: General: Alert, no acute distress Cardiovascular: No pedal edema Respiratory: No cyanosis, no use of accessory musculature GI: No organomegaly, abdomen is soft and non-tender Skin: No lesions in the area of chief complaint Neurologic: Sensation intact distally Psychiatric: Patient is competent for consent with normal mood and affect Lymphatic: No axillary or cervical lymphadenopathy  MUSCULOSKELETAL: Right hip has range of motion 0-90, with very limited internal rotation. External rotation is 10. EHL and FHL are firing. Positive pain with motion.  Assessment: DJD RIGHT HIP  Plan: Plan for Procedure(s): TOTAL HIP ARTHROPLASTY  The risks benefits and alternatives were discussed with the patient including but not limited to the risks of nonoperative treatment, versus surgical intervention including infection, bleeding, nerve injury, periprosthetic fracture, the need for revision surgery, dislocation, leg length discrepancy, blood clots, cardiopulmonary complications, morbidity, mortality, among others, and they were willing to proceed.     Eulas Post, MD Cell [phone]   03/26/2013 9:56 AM

## 2013-03-26 NOTE — Progress Notes (Signed)
Report given to Si Jachim rn as cargiver 

## 2013-03-26 NOTE — Transfer of Care (Signed)
Immediate Anesthesia Transfer of Care Note  Patient: Ivan Mcguire  Procedure(s) Performed: Procedure(s): TOTAL HIP ARTHROPLASTY (Right)  Patient Location: PACU  Anesthesia Type:General  Level of Consciousness: awake, alert  and oriented  Airway & Oxygen Therapy: Patient Spontanous Breathing and Patient connected to nasal cannula oxygen  Post-op Assessment: Report given to PACU RN and Post -op Vital signs reviewed and stable  Post vital signs: Reviewed and stable  Complications: No apparent anesthesia complications

## 2013-03-26 NOTE — Progress Notes (Signed)
RT set up CPAP for patient.  Patient was not wearing any oxygen and does not use oxygen with his CPAP at home. Patient brought his home nasal mask with him.  Patient was usnure of settings so RT set patient up on Auto titrate mode and set the pressure low at 5 cmH2O and the pressure high setting at 20 cmH2O.  Patient tolerating well at this time.  Rt will continue to monitor patient.

## 2013-03-26 NOTE — Anesthesia Preprocedure Evaluation (Signed)
Anesthesia Evaluation  Patient identified by MRN, date of birth, ID band Patient awake    Reviewed: Allergy & Precautions, H&P , NPO status , Patient's Chart, lab work & pertinent test results  Airway Mallampati: II TM Distance: >3 FB Neck ROM: full    Dental   Pulmonary sleep apnea ,          Cardiovascular hypertension, Rhythm:regular Rate:Normal     Neuro/Psych    GI/Hepatic   Endo/Other    Renal/GU      Musculoskeletal   Abdominal   Peds  Hematology   Anesthesia Other Findings   Reproductive/Obstetrics                           Anesthesia Physical Anesthesia Plan  ASA: II  Anesthesia Plan: General   Post-op Pain Management:    Induction: Intravenous  Airway Management Planned: Oral ETT  Additional Equipment:   Intra-op Plan:   Post-operative Plan: Extubation in OR  Informed Consent: I have reviewed the patients History and Physical, chart, labs and discussed the procedure including the risks, benefits and alternatives for the proposed anesthesia with the patient or authorized representative who has indicated his/her understanding and acceptance.     Plan Discussed with: CRNA, Anesthesiologist and Surgeon  Anesthesia Plan Comments:         Anesthesia Quick Evaluation

## 2013-03-26 NOTE — Anesthesia Procedure Notes (Signed)
Procedure Name: Intubation Date/Time: 03/26/2013 10:55 AM Performed by: Marena Chancy Pre-anesthesia Checklist: Emergency Drugs available, Patient identified, Suction available, Patient being monitored and Timeout performed Patient Re-evaluated:Patient Re-evaluated prior to inductionOxygen Delivery Method: Circle system utilized Preoxygenation: Pre-oxygenation with 100% oxygen Intubation Type: IV induction Ventilation: Oral airway inserted - appropriate to patient size and Mask ventilation throughout procedure Laryngoscope Size: Miller and 2 Grade View: Grade II Tube type: Oral Tube size: 8.0 mm Number of attempts: 1 Airway Equipment and Method: Bougie stylet Secured at: 24 cm Tube secured with: Tape Dental Injury: Teeth and Oropharynx as per pre-operative assessment

## 2013-03-26 NOTE — Progress Notes (Signed)
Utilization review complete. Ysabel Cowgill RN CCM Case Mgmt phone [phone] 

## 2013-03-26 NOTE — Preoperative (Signed)
Beta Blockers   Reason not to administer Beta Blockers:Not Applicable 

## 2013-03-26 NOTE — Anesthesia Postprocedure Evaluation (Signed)
  Anesthesia Post-op Note  Patient: Ivan Mcguire  Procedure(s) Performed: Procedure(s): TOTAL HIP ARTHROPLASTY (Right)  Patient Location: PACU  Anesthesia Type:General  Level of Consciousness: awake, oriented and patient cooperative  Airway and Oxygen Therapy: Patient Spontanous Breathing  Post-op Pain: moderate  Post-op Assessment: Post-op Vital signs reviewed, Patient's Cardiovascular Status Stable, Respiratory Function Stable, Patent Airway, No signs of Nausea or vomiting and Pain level controlled  Post-op Vital Signs: stable  Complications: No apparent anesthesia complications

## 2013-03-26 NOTE — Op Note (Signed)
03/26/2013  1:03 PM  PATIENT:  Ivan Mcguire   MRN: [id number]  PRE-OPERATIVE DIAGNOSIS:  DJD RIGHT HIP  POST-OPERATIVE DIAGNOSIS:  degenerative joint disease  PROCEDURE:  Procedure(s): TOTAL HIP ARTHROPLASTY  PREOPERATIVE INDICATIONS:    Ivan Mcguire is an 52 y.o. male who has a diagnosis of Osteoarthritis of right hip and elected for surgical management after failing conservative treatment.  The risks benefits and alternatives were discussed with the patient including but not limited to the risks of nonoperative treatment, versus surgical intervention including infection, bleeding, nerve injury, periprosthetic fracture, the need for revision surgery, dislocation, leg length discrepancy, blood clots, cardiopulmonary complications, morbidity, mortality, among others, and they were willing to proceed.    he did have other abuse risk factors including morbid obesity: Estimated body mass index is 39.53 kg/(m^2) as calculated from the following:   Height as of 03/19/13: 5\' 9"  (1.753 m).   Weight as of 01/16/13: 121.473 kg (267 lb 12.8 oz).  OPERATIVE REPORT     SURGEON:  Teryl Lucy, MD    ASSISTANT:  Janace Litten, OPA-C  (Present throughout the entire procedure,  necessary for completion of procedure in a timely manner, assisting with retraction, instrumentation, and closure)     ANESTHESIA:  General    COMPLICATIONS:  None.     COMPONENTS:  [employer] fit high offset femur size 6 with a 36 mm +5 head ball and a gription acetabular shell size 52 with a 10 degree lipped polyethylene liner    PROCEDURE IN DETAIL:   The patient was met in the holding area and  identified.  The appropriate hip was identified and marked at the operative site.  The patient was then transported to the OR  and  placed under general anesthesia.  At that point, the patient was  placed in the lateral decubitus position with the operative side up and  secured to the operating room table and all bony  prominences padded.     The operative lower extremity was prepped from the iliac crest to the distal leg.  Sterile draping was performed.  Time out was performed prior to incision.      A routine posterolateral approach was utilized via sharp dissection  carried down to the subcutaneous tissue.  Gross bleeders were Bovie coagulated.  The iliotibial band was identified and incised along the length of the skin incision.  Self-retaining retractors were  inserted.  With the hip internally rotated, the short external rotators  were identified. The piriformis and capsule was tagged with FiberWire, and the hip capsule released in a T-type fashion.  His hip was remarkably tight.  Access was extremely difficult throughout the case given his obesity, as well as the intrinsic contracture and tightness of his anatomy.   The femoral neck was exposed, and I resected the femoral neck using the appropriate jig. This was performed at approximately a thumb's breadth above the lesser trochanter. neck was fairly short, and I performed a relatively minimal resection, although it was still fairly short despite conservative on the.     I then exposed the deep acetabulum, cleared out any tissue including the ligamentum teres.  A wing retractor was placed.  After adequate visualization, I excised the labrum, and then sequentially reamed.   visualization was extremely difficult, but ultimately I was able to get adequate access.  I reamed up to a 52, for the 52 cup. He had a fair amount of cysts in the acetabulum. I then impacted the  real cup into place.  Appropriate version and inclination was confirmed clinically matching their bony anatomy, and also with the use of the jig.  A trial polyethylene liner was placed and the wing retractor removed.    I then prepared the proximal femur using the cookie-cutter, the lateralizing reamer, and then sequentially reamed and broached.  A trial broach, neck, and head was utilized, and I  reduced the hip and it was found to have excellent stability with functional range of motion. The trial components were then removed, and the real polyethylene liner was placed with the lip directed posteriorly. I did initially try with a standard femur, with the high offset provided better stability, and seem to recreate anatomy better. The leg lengths were restored with the trial.   I then impacted the real femoral prosthesis into place into the appropriate version, slightly anteverted to the normal anatomy, and I impacted the real head ball into place. The hip was then reduced and taken through functional range of motion and found to have excellent stability. Leg lengths were restored. he was still remarkably tight at the end of the case, but stable throughout functional range of motion.   I then used a 2 mm drill bits to pass the FiberWire suture from the capsule and piriformis through the greater trochanter, and secured this. Excellent posterior capsular repair was achieved. I also closed the T in the capsule.  I then irrigated the hip copiously again with pulse lavage, and repaired the fascia with Vicryl, followed by Vicryl for the subcutaneous tissue, Monocryl for the skin, Steri-Strips and sterile gauze. The wounds were injected. The patient was then awakened and returned to PACU in stable and satisfactory condition. There were no complications.  Teryl Lucy, MD Orthopedic Surgeon [phone]   03/26/2013 1:03 PM

## 2013-03-27 ENCOUNTER — Encounter (HOSPITAL_COMMUNITY): Payer: Self-pay | Admitting: General Practice

## 2013-03-27 LAB — CBC
HCT: 35.9 % — ABNORMAL LOW (ref 39.0–52.0)
MCH: 29.6 pg (ref 26.0–34.0)
MCHC: 34 g/dL (ref 30.0–36.0)
RDW: 14 % (ref 11.5–15.5)

## 2013-03-27 LAB — BASIC METABOLIC PANEL
BUN: 15 mg/dL (ref 6–23)
Calcium: 9.1 mg/dL (ref 8.4–10.5)
GFR calc Af Amer: >90 mL/min (ref >90)
GFR calc non Af Amer: >90 mL/min (ref >90)
Potassium: 4.5 mEq/L (ref 3.5–5.1)

## 2013-03-27 NOTE — Discharge Summary (Addendum)
Physician Discharge Summary  Patient ID: Quran Vasco MRN: [id number] DOB/AGE: 1960-11-07 52 y.o.  Admit date: 03/26/2013 Discharge date: 03/28/2013  Admission Diagnoses:  Osteoarthritis of right hip  Discharge Diagnoses:  Principal Problem:   Osteoarthritis of right hip   Past Medical History  Diagnosis Date  . Gynecomastia, male   . Chronic back pain   . Sleep apnea     cpap    last sleep study > 20 yrs  . HTN (hypertension)     no med  dr Cheree Ditto   pcp  . Osteoarthritis of right hip 03/26/2013    Surgeries: Procedure(s): TOTAL HIP ARTHROPLASTY on 03/26/2013   Consultants (if any):    Discharged Condition: Improved  Hospital Course: Jim Lundin is an 52 y.o. male who was admitted 03/26/2013 with a diagnosis of Osteoarthritis of right hip and went to the operating room on 03/26/2013 and underwent the above named procedures.    He was given perioperative antibiotics:  Anti-infectives   Start     Dose/Rate Route Frequency Ordered Stop   03/26/13 1700  ceFAZolin (ANCEF) IVPB 2 g/50 mL premix     2 g 100 mL/hr over 30 Minutes Intravenous Every 6 hours 03/26/13 1658 03/27/13 0004   03/26/13 0600  ceFAZolin (ANCEF) 3 g in dextrose 5 % 50 mL IVPB     3 g 160 mL/hr over 30 Minutes Intravenous On call to O.R. 03/25/13 1344 03/26/13 1059    .  He was given sequential compression devices, early ambulation, and lovenox for DVT prophylaxis.  He benefited maximally from the hospital stay and there were no complications.    Recent vital signs:  Filed Vitals:   03/27/13 0656  BP: 135/76  Pulse: 90  Temp: 98.9 F (37.2 C)  Resp: 18    Recent laboratory studies:  Lab Results  Component Value Date   HGB 12.2* 03/27/2013   HGB 12.8* 03/26/2013   HGB 15.2 03/19/2013   Lab Results  Component Value Date   WBC 15.9* 03/27/2013   PLT 219 03/27/2013   Lab Results  Component Value Date   INR 1.00 03/19/2013   Lab Results  Component Value Date   NA 139 03/27/2013   K 4.5 03/27/2013    CL 104 03/27/2013   CO2 25 03/27/2013   BUN 15 03/27/2013   CREATININE 0.70 03/27/2013   GLUCOSE 179* 03/27/2013    Discharge Medications:     Medication List    TAKE these medications       benzonatate 100 MG capsule  Commonly known as:  TESSALON  Take 1 capsule (100 mg total) by mouth 2 (two) times daily as needed for cough.     ibuprofen 200 MG tablet  Commonly known as:  ADVIL,MOTRIN  Take 800 mg by mouth every 6 (six) hours as needed for pain.     methocarbamol 500 MG tablet  Commonly known as:  ROBAXIN  Take 1 tablet (500 mg total) by mouth 4 (four) times daily.     multivitamin with minerals Tabs  Take 1 tablet by mouth daily.     oxyCODONE-acetaminophen 10-325 MG per tablet  Commonly known as:  PERCOCET  Take 1-2 tablets by mouth every 6 (six) hours as needed for pain. MAXIMUM TOTAL ACETAMINOPHEN DOSE IS 4000 MG PER DAY     promethazine 25 MG tablet  Commonly known as:  PHENERGAN  Take 1 tablet (25 mg total) by mouth every 6 (six) hours as needed for nausea.  sennosides-docusate sodium 8.6-50 MG tablet  Commonly known as:  SENOKOT-S  Take 2 tablets by mouth daily.     traMADol 50 MG tablet  Commonly known as:  ULTRAM  Take 50 mg by mouth 2 (two) times daily as needed.        Diagnostic Studies: Dg Chest 2 View  03/19/2013   *RADIOLOGY REPORT*  Clinical Data: Preop for hip surgery.  Hypertension.  CHEST - 2 VIEW  Comparison: None.  Findings: The heart, mediastinum and hila are within normal limits. The lungs are clear.  No pleural effusion or pneumothorax.  The bony thorax is demineralized but intact.  IMPRESSION: No active disease of the chest.   Original Report Authenticated By: Amie Portland, M.D.   Dg Pelvis Portable  03/26/2013   *RADIOLOGY REPORT*  Clinical Data: Postop total right hip replacement.  PORTABLE PELVIS  Comparison: None.  Findings: There is a right hip arthroplasty with the femoral and acetabular prosthetic components well aligned and well  seated. There is no acute fracture.  No evidence of an operative complication.   Original Report Authenticated By: Amie Portland, M.D.   Dg Hip Portable 1 View Right  03/26/2013   *RADIOLOGY REPORT*  Clinical Data: Portable, postop view of the hip.  PORTABLE RIGHT HIP - 1 VIEW  Comparison: 03/26/2013  Findings: The patient has undergone right hip arthroplasty.  There is no evidence for dislocation on this cross-table view.  Bony detail is limited.  IMPRESSION: No evidence for dislocation.   Original Report Authenticated By: Norva Pavlov, M.D.    Disposition: Final discharge disposition not confirmed      Discharge Orders   Future Orders Complete By Expires     Call MD / Call 911  As directed     Comments:      If you experience chest pain or shortness of breath, CALL 911 and be transported to the hospital emergency room.  If you develope a fever above 101 F, pus (white drainage) or increased drainage or redness at the wound, or calf pain, call your surgeon's office.    Change dressing  As directed     Comments:      You may change your dressing in 3 days, then change the dressing daily with sterile 4 x 4 inch gauze dressing and paper tape.  You may clean the incision with alcohol prior to redressing    Constipation Prevention  As directed     Comments:      Drink plenty of fluids.  Prune juice may be helpful.  You may use a stool softener, such as Colace (over the counter) 100 mg twice a day.  Use MiraLax (over the counter) for constipation as needed.    Diet general  As directed     Discharge instructions  As directed     Comments:      Change dressing in 3 days and reapply fresh dressing, unless you have a splint (half cast).  If you have a splint/cast, just leave in place until your follow-up appointment.    Keep wounds dry for 3 weeks.  Leave steri-strips in place on skin.  Do not apply lotion or anything to the wound.    Follow the hip precautions as taught in Physical Therapy  As  directed     Posterior total hip precautions  As directed     TED hose  As directed     Comments:      Use stockings (TED hose) for  2 weeks on both leg(s).  You may remove them at night for sleeping.    Weight bearing as tolerated  As directed     Weight bearing as tolerated  As directed        Follow-up Information   Follow up with Eulas Post, MD. Schedule an appointment as soon as possible for a visit in 2 weeks.   Contact information:   [address] [place] [address] [phone]        Signed: Eulas Post 03/27/2013, 8:31 AM

## 2013-03-27 NOTE — Progress Notes (Signed)
Physical Therapy Treatment Patient Details Name: Ivan Mcguire MRN: [id number] DOB: Oct 25, 1960 Today's Date: 03/27/2013 Time: 4098-1191 PT Time Calculation (min): 24 min  PT Assessment / Plan / Recommendation Comments on Treatment Session  Pt continues to make good progress with therapy. Anticipate pt to D/C home in morning. Discussed home safety techniques and stratgies with family. Will cont to f/u with pt while in acute setting. From PT standpoint, pt is safe to D/C home when medically ready.    Follow Up Recommendations  Home health PT;Supervision - Intermittent     Does the patient have the potential to tolerate intense rehabilitation     Barriers to Discharge        Equipment Recommendations  Rolling walker with 5" wheels;Other (comment)    Recommendations for Other Services    Frequency 7X/week   Plan Discharge plan remains appropriate;Frequency remains appropriate    Precautions / Restrictions Precautions Precautions: Posterior Hip Precaution Booklet Issued: Yes (comment) Precaution Comments: pt able to indpendently recall 3/3 hip precautions  Required Braces or Orthoses: Other Brace/Splint Other Brace/Splint: abduction pillow  Restrictions Weight Bearing Restrictions: Yes RLE Weight Bearing: Weight bearing as tolerated   Pertinent Vitals/Pain 3/10; denies need for pain medication.     Mobility  Bed Mobility Bed Mobility: Sit to Supine Sit to Supine: 4: Min guard;HOB elevated;With rail Details for Bed Mobility Assistance: (A) to control R LE onto bed; requries increased time due to pain  Transfers Transfers: Sit to Stand;Stand to Sit Sit to Stand: 6: Modified independent (Device/Increase time);From chair/3-in-1;With armrests Stand to Sit: 6: Modified independent (Device/Increase time);To bed;To elevated surface Details for Transfer Assistance: bed elevated to simulate home enviroment; pt demo good technique and safety with RW  Ambulation/Gait Ambulation/Gait  Assistance: 5: Supervision Ambulation Distance (Feet): 250 Feet Assistive device: Rolling walker Ambulation/Gait Assistance Details: pt able to amb without picking up RW to manuever it; demo increased ability to fully WB through R LE and rely less on UEs; pt amb with narrow BOS  Gait Pattern: Step-through pattern;Narrow base of support Gait velocity: decreased due to pain  Stairs: No Wheelchair Mobility Wheelchair Mobility: No    Exercises Total Joint Exercises Ankle Circles/Pumps: AROM;Both;10 reps;Seated Gluteal Sets: AROM;Both;10 reps;Seated Short Arc Quad: AROM;Right;10 reps;Supine Hip ABduction/ADduction: AAROM;Right;10 reps;Supine   PT Diagnosis:    PT Problem List:   PT Treatment Interventions:     PT Goals Acute Rehab PT Goals PT Goal Formulation: With patient Time For Goal Achievement: 03/30/13 Potential to Achieve Goals: Good PT Goal: Supine/Side to Sit - Progress: Progressing toward goal PT Goal: Sit to Supine/Side - Progress: Progressing toward goal PT Goal: Sit to Stand - Progress: Met PT Goal: Stand to Sit - Progress: Met PT Goal: Ambulate - Progress: Progressing toward goal Additional Goals PT Goal: Additional Goal #1 - Progress: Met  Visit Information  Last PT Received On: 03/27/13 Assistance Needed: +1    Subjective Data  Subjective: pt sitting in chair; with wife present; eager to walk  Patient Stated Goal: home ASAP    Cognition  Cognition Arousal/Alertness: Awake/alert Behavior During Therapy: WFL for tasks assessed/performed Overall Cognitive Status: Within Functional Limits for tasks assessed    Balance  Balance Balance Assessed: No  End of Session PT - End of Session Equipment Utilized During Treatment: Gait belt Activity Tolerance: Patient tolerated treatment well Patient left: in bed;with call bell/phone within reach;with family/visitor present Nurse Communication: Mobility status   GP     Donell Sievert, [place] [phone] 03/27/2013,  2:38 PM

## 2013-03-27 NOTE — Progress Notes (Signed)
Pt is wearing his home CPAP, not distress noted.

## 2013-03-27 NOTE — Progress Notes (Deleted)
Physical Therapy Treatment Patient Details Name: Ivan Mcguire MRN: [id number] DOB: 1961-10-04 Today's Date: 03/27/2013 Time: 0831-0901 PT Time Calculation (min): 30 min  PT Assessment / Plan / Recommendation Comments on Treatment Session       Follow Up Recommendations  Home health PT;Supervision - Intermittent     Does the patient have the potential to tolerate intense rehabilitation     Barriers to Discharge None      Equipment Recommendations  Rolling walker with 5" wheels;Other (comment) (3 in 1 commode)    Recommendations for Other Services OT consult  Frequency 7X/week   Plan      Precautions / Restrictions Precautions Precautions: Posterior Hip Precaution Booklet Issued: Yes (comment) Precaution Comments: given handout on precautions; pt able to indepdently recall 2/3 by end of session  Required Braces or Orthoses: Other Brace/Splint Other Brace/Splint: abduction pillow  Restrictions Weight Bearing Restrictions: Yes RLE Weight Bearing: Weight bearing as tolerated   Pertinent Vitals/Pain 5/10 premedicated.     Mobility  Bed Mobility Bed Mobility: Not assessed (pt sitting EOB) Transfers Transfers: Sit to Stand;Stand to Sit Sit to Stand: 4: Min guard;From bed;From elevated surface Stand to Sit: With armrests;4: Min guard;To chair/3-in-1 Details for Transfer Assistance: verbal cues to adhere to hip precautions; cues for safety and technique with RW  Ambulation/Gait Ambulation/Gait Assistance: 5: Supervision Ambulation Distance (Feet): 100 Feet Assistive device: Rolling walker Ambulation/Gait Assistance Details: prefers picking RW up to advance it; demo good ability to equally WB through LEs; verbal cues to not IR during manuvering around corners  Gait Pattern: Step-through pattern Gait velocity: decreased due to pain  Stairs: Yes Stairs Assistance: 4: Min guard Stair Management Technique: Two rails;Step to pattern;Forwards Number of Stairs: 3 Wheelchair  Mobility Wheelchair Mobility: No    Exercises Total Joint Exercises Ankle Circles/Pumps: AROM;10 reps;Supine;Both Gluteal Sets: AROM;Both;10 reps;Seated Hip ABduction/ADduction: AAROM;Right;10 reps;Seated Long Arc Quad: AROM;10 reps;Right;Seated   PT Diagnosis: Difficulty walking;Acute pain  PT Problem List: Decreased strength;Decreased range of motion;Decreased mobility;Decreased safety awareness;Decreased knowledge of use of DME;Decreased knowledge of precautions;Pain PT Treatment Interventions: DME instruction;Gait training;Stair training;Functional mobility training;Therapeutic activities;Therapeutic exercise;Neuromuscular re-education;Balance training;Patient/family education   PT Goals Acute Rehab PT Goals PT Goal Formulation: With patient Time For Goal Achievement: 03/30/13 Potential to Achieve Goals: Good Pt will go Supine/Side to Sit: with modified independence PT Goal: Supine/Side to Sit - Progress: Goal set today Pt will go Sit to Supine/Side: with modified independence PT Goal: Sit to Supine/Side - Progress: Goal set today Pt will go Sit to Stand: with modified independence PT Goal: Sit to Stand - Progress: Goal set today Pt will go Stand to Sit: with modified independence PT Goal: Stand to Sit - Progress: Goal set today Pt will Ambulate: >150 feet;with modified independence;with least restrictive assistive device PT Goal: Ambulate - Progress: Goal set today Pt will Go Up / Down Stairs: 3-5 stairs;with modified independence;with rail(s);with least restrictive assistive device PT Goal: Up/Down Stairs - Progress: Goal set today Additional Goals Additional Goal #1: Pt to be able to independently recall 3/3 hip precautions.  PT Goal: Additional Goal #1 - Progress: Goal set today  Visit Information  Last PT Received On: 03/27/13 Assistance Needed: +1    Subjective Data  Subjective: pt sitting EOB; eager and agreeable to therapy Patient Stated Goal: possibly home today    Cognition  Cognition Arousal/Alertness: Awake/alert Behavior During Therapy: WFL for tasks assessed/performed Overall Cognitive Status: Within Functional Limits for tasks assessed    Balance  Balance Balance  Assessed: No  End of Session PT - End of Session Equipment Utilized During Treatment: Gait belt Activity Tolerance: Patient tolerated treatment well Patient left: in chair;with call bell/phone within reach Nurse Communication: Mobility status;Other (comment) (DME needs)   GP     Donell Sievert, [place] [phone] 03/27/2013, 9:53 AM

## 2013-03-27 NOTE — Care Management Note (Signed)
CARE MANAGEMENT NOTE 03/27/2013  Patient:  Younce,Noelle   Account Number:  [id number]  Date Initiated:  03/26/2013  Documentation initiated by:  [facility]  Subjective/Objective Assessment:   TOTAL HIP ARTHROPLASTY     Action/Plan:   HH  CM spoke with patient concerning home health and DME needs. Choice offered. Patient has rolling walker and 3in1. Will have family support at discharge.   Anticipated DC Date:  03/28/2013   Anticipated DC Plan:  HOME W HOME HEALTH SERVICES      DC Planning Services  CM consult      [facility] Choice  HOME HEALTH   Choice offered to / List presented to:  C-1 Patient      DME agency  [employer]     HH arranged  HH-2 PT      [facility] agency  [facility]   Status of service:  Completed, signed off Medicare Important Message given?   (If response is "NO", the following Medicare IM given date fields will be blank) Date Medicare IM given:   Date Additional Medicare IM given:    Discharge Disposition:  HOME W HOME HEALTH SERVICES  Per UR Regulation:  Reviewed for med. necessity/level of care/duration of stay  If discussed at Long Length of Stay Meetings, dates discussed:    Comments:

## 2013-03-27 NOTE — Progress Notes (Signed)
Occupational Therapy Evaluation Patient Details Name: Ivan Mcguire MRN: [id number] DOB: [birth year] Today's Date: 03/27/2013 Time: 1131-1150 OT Time Calculation (min): 19 min  OT Assessment / Plan / Recommendation Clinical Impression  s/p R THA - post precautions. Completed educaiton with pt regarding ADL precautions/ADL/AE and DME. Pt able to return demonstrate and verbalize understanding. No further OT needed. Pt ready to D/C home.    OT Assessment  Patient does not need any further OT services    Follow Up Recommendations  No OT follow up    Barriers to Discharge      Equipment Recommendations  3 in 1 bedside comode    Recommendations for Other Services    Frequency    eval only   Precautions / Restrictions Precautions Precautions: Posterior Hip Precaution Booklet Issued:  (able to recall 3/3 precautions) Restrictions Weight Bearing Restrictions: Yes RLE Weight Bearing: Weight bearing as tolerated   Pertinent Vitals/Pain no apparent distress     ADL  Grooming: Modified independent Where Assessed - Grooming: Unsupported standing Upper Body Bathing: Set up Where Assessed - Upper Body Bathing: Unsupported sitting Lower Body Bathing: Moderate assistance Where Assessed - Lower Body Bathing: Unsupported sit to stand Upper Body Dressing: Set up Where Assessed - Upper Body Dressing: Unsupported sitting Lower Body Dressing: Moderate assistance Where Assessed - Lower Body Dressing: Unsupported sit to stand Toilet Transfer: Supervision/safety Toilet Transfer Method: Other (comment) (ambulaitng) Toilet Transfer Equipment: Bedside commode Toileting - Clothing Manipulation and Hygiene: Modified independent Where Assessed - Engineer, mining and Hygiene: Sit to stand from 3-in-1 or toilet Tub/Shower Transfer: Simulated;Supervision/safety Tub/Shower Transfer Method: Science writer: Other (comment) (3 in 1) Equipment Used: Gait  belt;Rolling walker;Sock aid;Long-handled sponge;Long-handled shoe horn;Reacher Transfers/Ambulation Related to ADLs: mod I ADL Comments: Educated pt on use fo AE to adhere to Post hip precautions. Pt able to return demonstrate    OT Diagnosis:    OT Problem List:   OT Treatment Interventions:     OT Goals Acute Rehab OT Goals OT Goal Formulation:  (eval only)  Visit Information  Last OT Received On: 03/27/13 Assistance Needed: +1    Subjective Data      Prior Functioning     Home Living Lives With: Spouse;Family Available Help at Discharge: Family;Available 24 hours/day Type of Home: House Home Access: Stairs to enter [employer] of Steps: 4 Entrance Stairs-Rails: Can reach both Home Layout: One level Bathroom Shower/Tub: Walk-in shower;Door [employer] Toilet: Pharmacist, community: Yes How Accessible: Accessible via walker Home Adaptive Equipment: None Prior Function Level of Independence: Independent Able to Take Stairs?: Yes Driving: Yes Vocation: Full time employment Communication Communication: No difficulties         Vision/Perception Vision - History Baseline Vision: Wears glasses all the time   Cognition  Cognition Arousal/Alertness: Awake/alert Behavior During Therapy: WFL for tasks assessed/performed Overall Cognitive Status: Within Functional Limits for tasks assessed    Extremity/Trunk Assessment Right Upper Extremity Assessment RUE ROM/Strength/Tone: [facility] for tasks assessed Left Upper Extremity Assessment LUE ROM/Strength/Tone: WFL for tasks assessed Right Lower Extremity Assessment RLE ROM/Strength/Tone: Unable to fully assess;Due to precautions (able to perform LAQ; DF/PF WFL ) RLE Sensation: WFL - Light Touch Left Lower Extremity Assessment LLE ROM/Strength/Tone: WFL for tasks assessed LLE Sensation: WFL - Light Touch Trunk Assessment Trunk Assessment: Normal     Mobility Bed Mobility Bed Mobility: Not  assessed (pt sitting EOB) Transfers Transfers: Sit to Stand;Stand to Sit Sit to Stand: 6: Modified independent (Device/Increase time);With upper extremity  assist;From chair/3-in-1 Stand to Sit: With armrests;To chair/3-in-1;6: Modified independent (Device/Increase time) Details for Transfer Assistance: verbal cues to adhere to hip precautions; cues for safety and technique with RW      Exercise     Balance Balance Balance Assessed: No   End of Session OT - End of Session Equipment Utilized During Treatment: Gait belt Activity Tolerance: Patient tolerated treatment well Patient left: in chair;with call bell/phone within reach Nurse Communication: Mobility status  GO     Kali Ambler,H[place]ARY 03/27/2013, 2:06 PM [facility], OTR/L  [phone] 03/27/2013

## 2013-03-27 NOTE — Progress Notes (Signed)
Patient ID: Ivan Mcguire, male   DOB: Jul 06, 1961, 52 y.o.   MRN: [id number]     Subjective:  Patient reports pain as mild to moderate.  Patient states that he doing well and denies any CP or SOB  Objective:   VITALS:   Filed Vitals:   03/26/13 2335 03/27/13 0316 03/27/13 0317 03/27/13 0656  BP:   129/72 135/76  Pulse:   93 90  Temp:   99.2 F (37.3 C) 98.9 F (37.2 C)  TempSrc:   Oral   Resp: 18 18 18 18   SpO2: 96% 95% 95% 99%    ABD soft Sensation intact distally Dorsiflexion/Plantar flexion intact Incision: dressing C/D/I and no drainage   Lab Results  Component Value Date   WBC 15.9* 03/27/2013   HGB 12.2* 03/27/2013   HCT 35.9* 03/27/2013   MCV 87.1 03/27/2013   PLT 219 03/27/2013     Assessment/Plan: 1 Day Post-Op   Principal Problem:   Osteoarthritis of right hip   Advance diet Up with therapy WBAT May plan to DC to home tomorrow if passes PT    Ivan Mcguire 03/27/2013, 11:53 AM   Teryl Lucy, MD Cell [phone]

## 2013-03-27 NOTE — Progress Notes (Signed)
03/27/13 0908  PT Visit Information  Last PT Received On 03/27/13  Assistance Needed +1  PT Time Calculation  PT Start Time 0831  PT Stop Time 0901  PT Time Calculation (min) 30 min  Subjective Data  Subjective pt sitting EOB; eager and agreeable to therapy  Patient Stated Goal possibly home today  Precautions  Precautions Posterior Hip  Precaution Booklet Issued Yes (comment)  Precaution Comments given handout on precautions; pt able to indepdently recall 2/3 by end of session   Required Braces or Orthoses Other Brace/Splint  Other Brace/Splint abduction pillow   Restrictions  Weight Bearing Restrictions Yes  RLE Weight Bearing WBAT  Home Living  Lives With Spouse;Family  Available Help at Discharge Family;Available 24 hours/day  Type of Home House  Home Access Stairs to enter  Entrance Stairs-Number of Steps 4  Entrance Stairs-Rails Can reach both  Home Layout One level  Bathroom Shower/Tub Walk-in shower;Door  Horticulturist, commercial Yes  How Accessible Accessible via walker  Home Adaptive Equipment None  Prior Function  Level of Independence Independent  Able to Take Stairs? Yes  Driving Yes  Vocation Full time employment  Communication  Communication No difficulties  Cognition  Arousal/Alertness Awake/alert  Behavior During Therapy WFL for tasks assessed/performed  Overall Cognitive Status Within Functional Limits for tasks assessed  Right Lower Extremity Assessment  RLE ROM/Strength/Tone Unable to fully assess;Due to precautions (able to perform LAQ; DF/PF WFL )  RLE Sensation WFL - Light Touch  Left Lower Extremity Assessment  LLE ROM/Strength/Tone WFL for tasks assessed  LLE Sensation WFL - Light Touch  Trunk Assessment  Trunk Assessment Normal  Bed Mobility  Bed Mobility Not assessed (pt sitting EOB)  Transfers  Transfers Sit to Stand;Stand to Sit  Sit to Stand 4: Min guard;From bed;From elevated surface  Stand to Sit With  armrests;4: Min guard;To chair/3-in-1  Details for Transfer Assistance verbal cues to adhere to hip precautions; cues for safety and technique with RW   Ambulation/Gait  Ambulation/Gait Assistance 5: Supervision  Ambulation Distance (Feet) 100 Feet  Assistive device Rolling walker  Ambulation/Gait Assistance Details prefers picking RW up to advance it; demo good ability to equally WB through LEs; verbal cues to not IR during manuvering around corners   Gait Pattern Step-through pattern  Gait velocity decreased due to pain   Stairs Yes  Stairs Assistance 4: Min guard  Stair Management Technique Two rails;Step to pattern;Forwards  Number of Stairs 3  Engineering geologist No  Balance  Balance Assessed No  Exercises  Exercises Total Joint  Total Joint Exercises  Ankle Circles/Pumps AROM;10 reps;Supine;Both  Gluteal Sets AROM;Both;10 reps;Seated  Hip ABduction/ADduction AAROM;Right;10 reps;Seated  Long Arc Quad AROM;10 reps;Right;Seated  PT - End of Session  Equipment Utilized During Treatment Gait belt  Activity Tolerance Patient tolerated treatment well  Patient left in chair;with call bell/phone within reach  Nurse Communication Mobility status;Other (comment) (DME needs)  PT Assessment  Clinical Impression Statement Pt is a 52 y.o. male s/p R THA POD#1. Pt moving well and presents with minimal mobility deficits. WIll benefit from skilled PT to maximize functional mobility and ensure safe transition home with HHPT and family. Pt possibly D/C today, is safe from PT standpoint with DME deleivered.   PT Recommendation/Assessment Patient needs continued PT services  PT Problem List Decreased strength;Decreased range of motion;Decreased mobility;Decreased safety awareness;Decreased knowledge of use of DME;Decreased knowledge of precautions;Pain  Barriers to Discharge None  PT Therapy  Diagnosis  Difficulty walking;Acute pain  PT Plan  PT Frequency 7X/week  PT  Treatment/Interventions DME instruction;Gait training;Stair training;Functional mobility training;Therapeutic activities;Therapeutic exercise;Neuromuscular re-education;Balance training;Patient/family education  PT Recommendation  Recommendations for Other Services OT consult  Follow Up Recommendations Home health PT;Supervision - Intermittent  PT equipment Rolling walker with 5" wheels;Other (comment) (3 in 1 commode)  Individuals Consulted  Consulted and Agree with Results and Recommendations Patient  Acute Rehab PT Goals  PT Goal Formulation With patient  Time For Goal Achievement 03/30/13  Potential to Achieve Goals Good  Pt will go Supine/Side to Sit with modified independence  PT Goal: Supine/Side to Sit - Progress Goal set today  Pt will go Sit to Supine/Side with modified independence  PT Goal: Sit to Supine/Side - Progress Goal set today  Pt will go Sit to Stand with modified independence  PT Goal: Sit to Stand - Progress Goal set today  Pt will go Stand to Sit with modified independence  PT Goal: Stand to Sit - Progress Goal set today  Pt will Ambulate >150 feet;with modified independence;with least restrictive assistive device  PT Goal: Ambulate - Progress Goal set today  Pt will Go Up / Down Stairs 3-5 stairs;with modified independence;with rail(s);with least restrictive assistive device  PT Goal: Up/Down Stairs - Progress Goal set today  Additional Goals  Additional Goal #1 Pt to be able to independently recall 3/3 hip precautions.   PT Goal: Additional Goal #1 - Progress Goal set today  PT General Charges  $$ ACUTE PT VISIT 1 Procedure  PT Evaluation  $Initial PT Evaluation Tier I 1 Procedure  PT Treatments  $Gait Training 8-22 mins  $Therapeutic Exercise 8-22 mins  [place], [place], [place] [phone]

## 2013-03-28 ENCOUNTER — Encounter (HOSPITAL_COMMUNITY): Payer: Self-pay | Admitting: Orthopedic Surgery

## 2013-03-28 LAB — BASIC METABOLIC PANEL
Chloride: 102 mEq/L (ref 96–112)
GFR calc Af Amer: >90 mL/min (ref >90)
GFR calc non Af Amer: >90 mL/min (ref >90)
Glucose, Bld: 217 mg/dL — ABNORMAL HIGH (ref 70–99)
Potassium: 4.1 mEq/L (ref 3.5–5.1)
Sodium: 136 mEq/L (ref 135–145)

## 2013-03-28 LAB — CBC
Hemoglobin: 11.1 g/dL — ABNORMAL LOW (ref 13.0–17.0)
MCHC: 33.5 g/dL (ref 30.0–36.0)
WBC: 11.2 10*3/uL — ABNORMAL HIGH (ref 4.0–10.5)

## 2013-03-28 NOTE — Progress Notes (Signed)
Patient ID: Ivan Mcguire, male   DOB: 02/09/1961, 52 y.o.   MRN: [id number]     Subjective:  Patient reports pain as mild.  Patient up sitting in a chair and dressed ready to go home.  Objective:   VITALS:   Filed Vitals:   03/27/13 1415 03/27/13 2012 03/28/13 0200 03/28/13 0548  BP: 143/80 158/67  142/63  Pulse: 88 104  115  Temp: 98.1 F (36.7 C) 100.4 F (38 C) 99.7 F (37.6 C) 98.8 F (37.1 C)  TempSrc:  Oral Oral Oral  Resp: 18 18  18   SpO2: 96% 97%  98%    ABD soft Sensation intact distally Dorsiflexion/Plantar flexion intact Incision: dressing C/D/I and no drainage Wound clean and dry no sign of infection.    Lab Results  Component Value Date   WBC 11.2* 03/28/2013   HGB 11.1* 03/28/2013   HCT 33.1* 03/28/2013   MCV 89.7 03/28/2013   PLT 171 03/28/2013     Assessment/Plan: 2 Days Post-Op   Principal Problem:   Osteoarthritis of right hip   Advance diet Up with therapy Discharge home with home health WBAT  Dry dressing prn Follow up in two weeks with Dr Jenell Milliner, [facility] 03/28/2013, 7:38 AM   Teryl Lucy, MD Cell [phone]

## 2013-03-28 NOTE — Progress Notes (Signed)
Physical Therapy Treatment Patient Details Name: Dishawn Bhargava MRN: [id number] DOB: 1961-09-03 Today's Date: 03/28/2013 Time: 4098-1191 PT Time Calculation (min): 24 min  PT Assessment / Plan / Recommendation Comments on Treatment Session  Pt is making great progress; and moving well with therapy. Pt has met all goals in acute care at this time. Is safe for D/C home with wife and transition to HHPT    Follow Up Recommendations  Home health PT;Supervision - Intermittent     Does the patient have the potential to tolerate intense rehabilitation     Barriers to Discharge        Equipment Recommendations  Rolling walker with 5" wheels;Other (comment)    Recommendations for Other Services    Frequency 7X/week   Plan Discharge plan remains appropriate;Frequency remains appropriate    Precautions / Restrictions Precautions Precautions: Posterior Hip Precaution Booklet Issued: Yes (comment) Precaution Comments: pt able to indpendently recall 3/3 hip precautions  Required Braces or Orthoses: Other Brace/Splint Other Brace/Splint: abduction pillow  Restrictions Weight Bearing Restrictions: Yes RLE Weight Bearing: Weight bearing as tolerated   Pertinent Vitals/Pain 7/10; pt premedicated and given ice to reduce pain     Mobility  Bed Mobility Bed Mobility: Not assessed Transfers Transfers: Sit to Stand;Stand to Sit Sit to Stand: 6: Modified independent (Device/Increase time);From chair/3-in-1;With armrests Stand to Sit: 6: Modified independent (Device/Increase time);To chair/3-in-1;With armrests Details for Transfer Assistance: pt demo good technique; required increased time today due to "stiffness" in hip Ambulation/Gait Ambulation/Gait Assistance: 6: Modified independent (Device/Increase time) Ambulation Distance (Feet): 300 Feet Assistive device: Rolling walker Ambulation/Gait Assistance Details: demo appropriate step through gt; cont to amb with narrow BOS Gait Pattern:  Step-through pattern;Narrow base of support Gait velocity: decreased due to pain  Stairs: Yes Stairs Assistance: 5: Supervision Stairs Assistance Details (indicate cue type and reason): pt able to recall proper gt sequencing; min cues for safety with steps  Stair Management Technique: Two rails;Step to pattern;Forwards Number of Stairs: 3 Wheelchair Mobility Wheelchair Mobility: No    Exercises Total Joint Exercises Ankle Circles/Pumps: AROM;Both;10 reps;Seated Gluteal Sets: AROM;Both;10 reps;Seated Towel Squeeze: AROM;Both;10 reps;Seated Long Arc Quad: AROM;10 reps;Right;Seated   PT Diagnosis:    PT Problem List:   PT Treatment Interventions:     PT Goals Acute Rehab PT Goals PT Goal Formulation: With patient Time For Goal Achievement: 03/30/13 Potential to Achieve Goals: Good PT Goal: Sit to Stand - Progress: Met PT Goal: Stand to Sit - Progress: Met PT Goal: Ambulate - Progress: Met PT Goal: Up/Down Stairs - Progress: Met Additional Goals PT Goal: Additional Goal #1 - Progress: Met  Visit Information  Last PT Received On: 03/28/13 Assistance Needed: +1    Subjective Data  Subjective: pt sitting in chair; agreeable to therapy  Patient Stated Goal: home today    Cognition  Cognition Arousal/Alertness: Awake/alert Behavior During Therapy: WFL for tasks assessed/performed Overall Cognitive Status: Within Functional Limits for tasks assessed    Balance  Balance Balance Assessed: No  End of Session PT - End of Session Equipment Utilized During Treatment: Gait belt Activity Tolerance: Patient tolerated treatment well Patient left: in chair;with call bell/phone within reach Nurse Communication: Mobility status   GP     Donell Sievert, [place] [phone] 03/28/2013, 9:03 AM

## 2013-03-28 NOTE — Progress Notes (Signed)
Pt discharged to home accompanied by family. Discharge instructions and rx given and explained and pt stated understanding. Pts IV was removed. Pt left unit in a stable condition via wheelchair. 

## 2013-04-22 ENCOUNTER — Other Ambulatory Visit: Payer: Self-pay

## 2013-04-22 NOTE — Telephone Encounter (Signed)
Pt left v/m Dr Para March had given Tramadol in past for back pain; pt recently had hip replacement and cannot take ibuprofen and request refill tramadol to [facility].Please advise.

## 2013-04-23 MED ORDER — TRAMADOL HCL 50 MG PO TABS: 50.0000 mg | ORAL_TABLET | Freq: Two times a day (BID) | ORAL | Status: DC | PRN

## 2013-04-23 NOTE — Telephone Encounter (Signed)
Sent!

## 2013-08-29 ENCOUNTER — Other Ambulatory Visit: Payer: Self-pay

## 2016-07-08 ENCOUNTER — Other Ambulatory Visit: Payer: Self-pay | Admitting: Primary Care

## 2016-07-08 ENCOUNTER — Ambulatory Visit (INDEPENDENT_AMBULATORY_CARE_PROVIDER_SITE_OTHER): Payer: BLUE CROSS/BLUE SHIELD | Admitting: Primary Care

## 2016-07-08 ENCOUNTER — Encounter: Payer: Self-pay | Admitting: Primary Care

## 2016-07-08 VITALS — BP 174/94 | HR 101 | Temp 98.7°F | Ht 69.0 in | Wt 218.8 lb

## 2016-07-08 DIAGNOSIS — E1149 Type 2 diabetes mellitus with other diabetic neurological complication: Secondary | ICD-10-CM | POA: Insufficient documentation

## 2016-07-08 DIAGNOSIS — E119 Type 2 diabetes mellitus without complications: Secondary | ICD-10-CM

## 2016-07-08 DIAGNOSIS — I10 Essential (primary) hypertension: Secondary | ICD-10-CM | POA: Insufficient documentation

## 2016-07-08 LAB — COMPREHENSIVE METABOLIC PANEL
ALK PHOS: 158 U/L — AB (ref 39–117)
ALT: 81 U/L — AB (ref 0–53)
AST: 62 U/L — AB (ref 0–37)
Albumin: 4.2 g/dL (ref 3.5–5.2)
BILIRUBIN TOTAL: 0.5 mg/dL (ref 0.2–1.2)
BUN: 14 mg/dL (ref 6–23)
CO2: 26 meq/L (ref 19–32)
CREATININE: 0.86 mg/dL (ref 0.40–1.50)
Calcium: 9.9 mg/dL (ref 8.4–10.5)
Chloride: 99 mEq/L (ref 96–112)
GFR: 97.52 mL/min (ref >60.00)
GLUCOSE: 437 mg/dL — AB (ref 70–99)
Potassium: 4.6 mEq/L (ref 3.5–5.1)
SODIUM: 134 meq/L — AB (ref 135–145)
TOTAL PROTEIN: 7.9 g/dL (ref 6.0–8.3)

## 2016-07-08 LAB — MICROALBUMIN / CREATININE URINE RATIO
Creatinine,U: 33.5 mg/dL
Microalb Creat Ratio: 2.1 mg/g (ref 0.0–30.0)
Microalb, Ur: <0.7 mg/dL (ref 0.0–1.9)

## 2016-07-08 LAB — HEMOGLOBIN A1C: Hgb A1c MFr Bld: 12.8 % — ABNORMAL HIGH (ref 4.6–6.5)

## 2016-07-08 MED ORDER — METFORMIN HCL 1000 MG PO TABS: ORAL_TABLET | ORAL | 0 refills | Status: DC

## 2016-07-08 MED ORDER — LISINOPRIL 20 MG PO TABS: 20.0000 mg | ORAL_TABLET | Freq: Every day | ORAL | 1 refills | Status: DC

## 2016-07-08 NOTE — Progress Notes (Signed)
Pre visit review using our clinic review tool, if applicable. No additional management support is needed unless otherwise documented below in the visit note. 

## 2016-07-08 NOTE — Progress Notes (Signed)
Subjective:    Patient ID: Ivan Mcguire, male    DOB: 1960/11/20, 55 y.o.   MRN: [id number]  HPI  [name] is a 55 year old male who presents today with a chief complaint of hyperglycemia. He has no prior history of diabetes, but he was told in 2014 that he had prediabetes.   He's noticed polydipsia, polyuria, changes in vision, and pain/numbness/tingling to the plantar surface of his bilateral feet for the past 3-4 months. He checked his blood sugar this morning with a co-workers meter which read 404. He ate a muffin 30 minutes prior to checking. He endorses a 70 pound weight loss in the last several years as he has been more active. He's never completed an eye evaluation with an optometrist. Denies family history of diabetes.  His diet currently consists of: Breakfast: Cereal, fast food, skips a lot Lunch: Sandwich, vending machine food Dinner: [place] food, soup, meat, vegetable, rice Snacks: Candy, ice cream Desserts: 3-4 times weekly Beverages: Diet gatorate, vitamin water, diet soda, no water, several beers 3-4 days weekly.  Exercise: He does not currently exercise.   2) Essential Hypertension: History of elevated BP readings in 2014, above goal in the office today. His blood pressure was "high" at his dentists office 3 weeks ago, cannot remember the exact numbers. Denies chest pain, dizziness, shortness of breath, lower extremity edema. He has noticed visual changes but has attributed this to age.  BP Readings from Last 3 Encounters:  07/08/16 (!) 174/94  03/28/13 (!) 142/63  03/20/13 (!) 142/82     Review of Systems  Constitutional: Negative for unexpected weight change.  Eyes: Positive for visual disturbance.  Respiratory: Negative for shortness of breath.   Cardiovascular: Negative for chest pain.  Gastrointestinal: Negative for abdominal pain and nausea.  Endocrine: Positive for polydipsia and polyuria.  Neurological: Positive for numbness. Negative for  dizziness and headaches.       Past Medical History:  Diagnosis Date  . Chronic back pain   . Gynecomastia, male   . HTN (hypertension)    no med  dr Phillip Heal   pcp  . Osteoarthritis of right hip 03/26/2013  . Sleep apnea    cpap    last sleep study > 20 yrs     Social History   Social History  . Marital status: Married    Spouse name: N/A  . Number of children: N/A  . Years of education: N/A   Occupational History  . Not on file.   Social History Main Topics  . Smoking status: Never Smoker  . Smokeless tobacco: Never Used  . Alcohol use Yes     Comment: couple beers per month  . Drug use: No  . Sexual activity: Not on file   Other Topics Concern  . Not on file   Social History Narrative  . No narrative on file    Past Surgical History:  Procedure Laterality Date  . TOTAL HIP ARTHROPLASTY Right 03/26/2013   Dr Mardelle Matte  . TOTAL HIP ARTHROPLASTY Right 03/26/2013   Procedure: TOTAL HIP ARTHROPLASTY;  Surgeon: Johnny Bridge, MD;  Location: [place];  Service: Orthopedics;  Laterality: Right;    Family History  Problem Relation Age of Onset  . Angina Mother   . CAD Father     cerebral aneurysm  . Dementia Father 38  . Diabetes Brother   . Cancer Maternal Aunt     pancreas?    No Known Allergies  No current  outpatient prescriptions on file prior to visit.   No current facility-administered medications on file prior to visit.     BP (!) 174/94   Pulse (!) 101   Temp 98.7 F (37.1 C) (Oral)   Ht 5\' 9"  (1.753 m)   Wt 218 lb 12.8 oz (99.2 kg)   SpO2 96%   BMI 32.31 kg/m    Objective:   Physical Exam  Constitutional: He appears well-nourished.  Eyes: Conjunctivae are normal.  Neck: Neck supple.  Cardiovascular: Normal rate and regular rhythm.   Pulmonary/Chest: Effort normal and breath sounds normal.  Skin: Skin is warm and dry.          Assessment & Plan:

## 2016-07-08 NOTE — Assessment & Plan Note (Signed)
No prior history, blood sugar reading of 404 on coworkers meter today. Symptomatic with visual changes, polyuria, polydipsia, neuropathy to feet.  A1c, urine microalbumin, CMP ordered today and is pending. Foot exam completed today and is unremarkable. Initiated on Ace due to central hypertension. Check lipids and administer pneumonia vaccination at next visit. Referral to diabetes nutritionist and optometrist placed today. Will initiate oral medication depending on result of A1c which is pending.  Handouts provided regarding diabetic diet. Long discussion today regarding diagnosis of diabetes. Discussed instructions in detail including labs, medications, diet, exercise, follow-up. He will schedule a follow-up appointment in 3 months with his PCP.

## 2016-07-08 NOTE — Patient Instructions (Addendum)
You have Type 2 Diabetes which means that your blood sugar levels are too high. Take a look at the information below.  You have high blood pressure which needs to be treated. Start Lisinopril 20 mg tablets. Take 1 tablet by mouth once every morning.   Check your blood pressure daily, around the same time of day, for the next 3 weeks.   Ensure that you have rested for 30 minutes prior to checking your blood pressure. Record your readings and bring them to your next visit.  You will be contacted regarding your referral to the diabetic nutritionist and eye doctor.  Please let us know if you have not heard back within one week.   Complete lab work prior to leaving today. I will notify you of your results once received.   It is important that you improve your diet. Please limit carbohydrates in the form of white bread, rice, pasta, cakes, cookies, sugary drinks, etc. Increase your consumption of fresh fruits and vegetables, whole grains, lean protein. Decrease consumption of salty foods.  Ensure you are consuming 64 ounces of water daily.  Start exercising. You should be getting 150 minutes of moderate intensity exercise weekly. Work up to this over time.  Schedule a follow up appointment with Dr. Damita Dunnings in 3 weeks for re-evaluation of your blood pressure.  It was a pleasure meeting you! Please don't hesitate to call or e-mail myself or Dr. Damita Dunnings if you have any other questions.  Type 2 Diabetes Mellitus, Adult Type 2 diabetes mellitus, often simply referred to as type 2 diabetes, is a long-lasting (chronic) disease. In type 2 diabetes, the pancreas does not make enough insulin (a hormone), the cells are less responsive to the insulin that is made (insulin resistance), or both. Normally, insulin moves sugars from food into the tissue cells. The tissue cells use the sugars for energy. The lack of insulin or the lack of normal response to insulin causes excess sugars to build up in the blood instead  of going into the tissue cells. As a result, high blood sugar (hyperglycemia) develops. The effect of high sugar (glucose) levels can cause many complications. Type 2 diabetes was also previously called adult-onset diabetes, but it can occur at any age.  RISK FACTORS  A person is predisposed to developing type 2 diabetes if someone in the family has the disease and also has one or more of the following primary risk factors:  Weight gain, or being overweight or obese.  An inactive lifestyle.  A history of consistently eating high-calorie foods. Maintaining a normal weight and regular physical activity can reduce the chance of developing type 2 diabetes. SYMPTOMS  A person with type 2 diabetes may not show symptoms initially. The symptoms of type 2 diabetes appear slowly. The symptoms include:  Increased thirst (polydipsia).  Increased urination (polyuria).  Increased urination during the night (nocturia).  Sudden or unexplained weight changes.  Frequent, recurring infections.  Tiredness (fatigue).  Weakness.  Vision changes, such as blurred vision.  Fruity smell to your breath.  Abdominal pain.  Nausea or vomiting.  Cuts or bruises which are slow to heal.  Tingling or numbness in the hands or feet.  An open skin wound (ulcer). DIAGNOSIS Type 2 diabetes is frequently not diagnosed until complications of diabetes are present. Type 2 diabetes is diagnosed when symptoms or complications are present and when blood glucose levels are increased. Your blood glucose level may be checked by one or more of the following blood  tests:  A fasting blood glucose test. You will not be allowed to eat for at least 8 hours before a blood sample is taken.  A random blood glucose test. Your blood glucose is checked at any time of the day regardless of when you ate.  A hemoglobin A1c blood glucose test. A hemoglobin A1c test provides information about blood glucose control over the  previous 3 months.  An oral glucose tolerance test (OGTT). Your blood glucose is measured after you have not eaten (fasted) for 2 hours and then after you drink a glucose-containing beverage. TREATMENT   You may need to take insulin or diabetes medicine daily to keep blood glucose levels in the desired range.  If you use insulin, you may need to adjust the dosage depending on the carbohydrates that you eat with each meal or snack.  Lifestyle changes are recommended as part of your treatment. These may include:  Following an individualized diet plan developed by a nutritionist or dietitian.  Exercising daily. Your health care providers will set individualized treatment goals for you based on your age, your medicines, how long you have had diabetes, and any other medical conditions you have. Generally, the goal of treatment is to maintain the following blood glucose levels:  Before meals (preprandial): 80-130 mg/dL.  After meals (postprandial): below 180 mg/dL.  A1c: less than 6.5-7%. HOME CARE INSTRUCTIONS   Have your hemoglobin A1c level checked twice a year.  Perform daily blood glucose monitoring as directed by your health care provider.  Monitor urine ketones when you are ill and as directed by your health care provider.  Take your diabetes medicine or insulin as directed by your health care provider to maintain your blood glucose levels in the desired range.  Never run out of diabetes medicine or insulin. It is needed every day.  If you are using insulin, you may need to adjust the amount of insulin given based on your intake of carbohydrates. Carbohydrates can raise blood glucose levels but need to be included in your diet. Carbohydrates provide vitamins, minerals, and fiber which are an essential part of a healthy diet. Carbohydrates are found in fruits, vegetables, whole grains, dairy products, legumes, and foods containing added sugars.  Eat healthy foods. You should make  an appointment to see a registered dietitian to help you create an eating plan that is right for you.  Lose weight if you are overweight.  Carry a medical alert card or wear your medical alert jewelry.  Carry a 15-gram carbohydrate snack with you at all times to treat low blood glucose (hypoglycemia). Some examples of 15-gram carbohydrate snacks include:  Glucose tablets, 3 or 4.  Glucose gel, 15-gram tube.  Raisins, 2 tablespoons (24 grams).  Jelly beans, 6.  Animal crackers, 8.  Regular pop, 4 ounces (120 mL).  Gummy treats, 9.  Recognize hypoglycemia. Hypoglycemia occurs with blood glucose levels of 70 mg/dL and below. The risk for hypoglycemia increases when fasting or skipping meals, during or after intense exercise, and during sleep. Hypoglycemia symptoms can include:  Tremors or shakes.  Decreased ability to concentrate.  Sweating.  Increased heart rate.  Headache.  Dry mouth.  Hunger.  Irritability.  Anxiety.  Restless sleep.  Altered speech or coordination.  Confusion.  Treat hypoglycemia promptly. If you are alert and able to safely swallow, follow the 15:15 rule:  Take 15-20 grams of rapid-acting glucose or carbohydrate. Rapid-acting options include glucose gel, glucose tablets, or 4 ounces (120 mL) of fruit  juice, regular soda, or low-fat milk.  Check your blood glucose level 15 minutes after taking the glucose.  Take 15-20 grams more of glucose if the repeat blood glucose level is still 70 mg/dL or below.  Eat a meal or snack within 1 hour once blood glucose levels return to normal.  Be alert to feeling very thirsty and urinating more frequently than usual, which are early signs of hyperglycemia. An early awareness of hyperglycemia allows for prompt treatment. Treat hyperglycemia as directed by your health care provider.  Engage in at least 150 minutes of moderate-intensity physical activity a week, spread over at least 3 days of the week or as  directed by your health care provider. In addition, you should engage in resistance exercise at least 2 times a week or as directed by your health care provider. Try to spend no more than 90 minutes at one time inactive.  Adjust your medicine and food intake as needed if you start a new exercise or sport.  Follow your sick-day plan anytime you are unable to eat or drink as usual.  Do not use any tobacco products including cigarettes, chewing tobacco, or electronic cigarettes. If you need help quitting, ask your health care provider.  Limit alcohol intake to no more than 1 drink per day for nonpregnant women and 2 drinks per day for men. You should drink alcohol only when you are also eating food. Talk with your health care provider whether alcohol is safe for you. Tell your health care provider if you drink alcohol several times a week.  Keep all follow-up visits as directed by your health care provider. This is important.  Schedule an eye exam soon after the diagnosis of type 2 diabetes and then annually.  Perform daily skin and foot care. Examine your skin and feet daily for cuts, bruises, redness, nail problems, bleeding, blisters, or sores. A foot exam by a health care provider should be done annually.  Brush your teeth and gums at least twice a day and floss at least once a day. Follow up with your dentist regularly.  Share your diabetes management plan with your workplace or school.  Keep your immunizations up to date. It is recommended that you receive a flu (influenza) vaccine every year. It is also recommended that you receive a pneumonia (pneumococcal) vaccine. If you are 61 years of age or older and have never received a pneumonia vaccine, this vaccine may be given as a series of two separate shots. Ask your health care provider which additional vaccines may be recommended.  Learn to manage stress.  Obtain ongoing diabetes education and support as needed.  Participate in or seek  rehabilitation as needed to maintain or improve independence and quality of life. Request a physical or occupational therapy referral if you are having foot or hand numbness, or difficulties with grooming, dressing, eating, or physical activity. SEEK MEDICAL CARE IF:   You are unable to eat food or drink fluids for more than 6 hours.  You have nausea and vomiting for more than 6 hours.  Your blood glucose level is over 240 mg/dL.  There is a change in mental status.  You develop an additional serious illness.  You have diarrhea for more than 6 hours.  You have been sick or have had a fever for a couple of days and are not getting better.  You have pain during any physical activity.  SEEK IMMEDIATE MEDICAL CARE IF:  You have difficulty breathing.  You  have moderate to large ketone levels.   This information is not intended to replace advice given to you by your health care provider. Make sure you discuss any questions you have with your health care provider.   Document Released: 10/10/2005 Document Revised: 07/01/2015 Document Reviewed: 05/08/2012 Elsevier Interactive Patient Education [employer].

## 2016-07-08 NOTE — Assessment & Plan Note (Signed)
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 Asymptomatic except for visual changes. Prescription for lisinopril 20 mg tablets sent to pharmacy. He will monitor blood pressure daily for the next 2-3 weeks and follow-up with PCP with home readings and reevaluation including BMP. Discussed low-salt diet.

## 2016-07-13 ENCOUNTER — Ambulatory Visit (INDEPENDENT_AMBULATORY_CARE_PROVIDER_SITE_OTHER): Payer: BLUE CROSS/BLUE SHIELD | Admitting: Family Medicine

## 2016-07-13 ENCOUNTER — Encounter: Payer: Self-pay | Admitting: Family Medicine

## 2016-07-13 VITALS — BP 138/84 | HR 80 | Temp 98.5°F | Wt 217.8 lb

## 2016-07-13 DIAGNOSIS — E119 Type 2 diabetes mellitus without complications: Secondary | ICD-10-CM | POA: Diagnosis not present

## 2016-07-13 DIAGNOSIS — I10 Essential (primary) hypertension: Secondary | ICD-10-CM

## 2016-07-13 NOTE — Progress Notes (Signed)
Pre visit review using our clinic review tool, if applicable. No additional management support is needed unless otherwise documented below in the visit note. 

## 2016-07-13 NOTE — Patient Instructions (Addendum)
Use the eat right diet.  Schedule the diabetes classes when possible.  Gradually work up on the metformin, cut back on the dose if you have diarrhea.  Let me know about your sugars if they start going back up in the meantime.  Take care.  Glad to see you.  Thanks for your effort.

## 2016-07-13 NOTE — Progress Notes (Signed)
HTN.  No ADE on med.  Compliant.  No CP SOB BLE edema.   DM2.  Recent dx.  Labs d/w pt.  Sugar 230 last night.   This is an improvement.  Patient has noted that his vision is adjusting as his sugars lower. He's having less foot pain since that sugar has been lower. Compliant with meds, is slowly titrating up his metformin. Due for repeat labs today.  PMH and SH reviewed  ROS: Per HPI unless specifically indicated in ROS section   Meds, vitals, and allergies reviewed.   GEN: nad, alert and oriented HEENT: mucous membranes moist NECK: supple w/o LA CV: rrr.  PULM: ctab, no inc wob ABD: soft, +bs EXT: no edema SKIN: no acute rash

## 2016-07-14 ENCOUNTER — Encounter: Payer: Self-pay | Admitting: Family Medicine

## 2016-07-14 LAB — BASIC METABOLIC PANEL
BUN: 17 mg/dL (ref 6–23)
CHLORIDE: 100 meq/L (ref 96–112)
CO2: 26 mEq/L (ref 19–32)
CREATININE: 0.8 mg/dL (ref 0.40–1.50)
Calcium: 9.7 mg/dL (ref 8.4–10.5)
GFR: 106 mL/min (ref >60.00)
GLUCOSE: 178 mg/dL — AB (ref 70–99)
POTASSIUM: 5.2 meq/L — AB (ref 3.5–5.1)
Sodium: 134 mEq/L — ABNORMAL LOW (ref 135–145)

## 2016-07-14 NOTE — Assessment & Plan Note (Addendum)
Not controlled, but improved. Less thirsty. Less polyuria. Foot burning is improved. His vision is adjusting, discussed with patient about likely having transient warping of the lens due to hyperglycemia. Continue slow increase in metformin. GI cautions given. See notes on labs. Recheck in about 3 weeks. He'll update me sooner if needed. Pathophysiology of diabetes discussed patient. Will work on his other health maintenance issues of go along. Flu shot to be done at work. D/w pt about diet and exercise.  Low carb handout given and d/w pt. >25 minutes spent in face to face time with patient, >50% spent in counselling or coordination of care.  DM2 education classes pending.

## 2016-07-14 NOTE — Assessment & Plan Note (Signed)
Now controlled. On ace. See notes on labs. Routine Ace cautions given.

## 2016-07-29 ENCOUNTER — Encounter: Payer: Self-pay | Admitting: Family Medicine

## 2016-07-29 ENCOUNTER — Ambulatory Visit (INDEPENDENT_AMBULATORY_CARE_PROVIDER_SITE_OTHER): Payer: BLUE CROSS/BLUE SHIELD | Admitting: Family Medicine

## 2016-07-29 VITALS — BP 114/76 | HR 84 | Temp 98.3°F | Wt 213.5 lb

## 2016-07-29 DIAGNOSIS — Z23 Encounter for immunization: Secondary | ICD-10-CM

## 2016-07-29 DIAGNOSIS — I10 Essential (primary) hypertension: Secondary | ICD-10-CM | POA: Diagnosis not present

## 2016-07-29 DIAGNOSIS — E1149 Type 2 diabetes mellitus with other diabetic neurological complication: Secondary | ICD-10-CM | POA: Diagnosis not present

## 2016-07-29 MED ORDER — METFORMIN HCL 1000 MG PO TABS: 1000.0000 mg | ORAL_TABLET | Freq: Two times a day (BID) | ORAL | 3 refills | Status: DC

## 2016-07-29 MED ORDER — GABAPENTIN 100 MG PO CAPS: ORAL_CAPSULE | ORAL | 3 refills | Status: DC

## 2016-07-29 MED ORDER — LISINOPRIL 20 MG PO TABS: 20.0000 mg | ORAL_TABLET | Freq: Every day | ORAL | 3 refills | Status: DC

## 2016-07-29 NOTE — Progress Notes (Signed)
Pre visit review using our clinic review tool, if applicable. No additional management support is needed unless otherwise documented below in the visit note. 

## 2016-07-29 NOTE — Patient Instructions (Signed)
Recheck in labs at or before a visit in mid December (after 10/07/16).   Try the gabapentin in the meantime, as tolerated.  See if that helps the foot pain.  You can taper the metformin if you have low sugars.   Take care.  Glad to see you.

## 2016-07-29 NOTE — Progress Notes (Signed)
DM f/u On 2000mg  metformin daily with 20mg  lisinopril.  No ADE on med.  Using medications without difficulties:yes Hypoglycemic episodes: no Hyperglycemic episodes: no Feet problems:yes, burning and pain in the feet Blood Sugars averaging: usually ~120, down to ~100 occ. eye exam within last year: f/u pending.   He cut a lot of carbs out of his diet.   No labs done today.   Flu shot to be done at work next week.    Meds, vitals, and allergies reviewed.   ROS: Per HPI unless specifically indicated in ROS section   GEN: nad, alert and oriented HEENT: mucous membranes moist NECK: supple w/o LA CV: rrr. PULM: ctab, no inc wob ABD: soft, +bs EXT: no edema  Diabetic foot exam: Normal inspection No skin breakdown No calluses  Normal DP pulses Normal sensation to light touch and monofilament Nails normal

## 2016-07-30 NOTE — Assessment & Plan Note (Signed)
No need for repeat labs today. Sugar is much improved in the meantime. Continue work on diet and exercise. Continue his current meds. He still has a lot of foot pain, likely from diabetic neuropathy. His foot exam is normal today otherwise. He has no skin breakdown. Start gabapentin 100 mg a day. Can gradually increase the dose as tolerated up to 300 mg 3 times a day. Sedation caution given. He will continue to check his sugar at home. Recheck A1c here in mid December. He'll update me as needed. He will get a flu shot at work.

## 2016-08-30 ENCOUNTER — Encounter: Payer: Self-pay | Admitting: Primary Care

## 2016-08-30 DIAGNOSIS — E119 Type 2 diabetes mellitus without complications: Secondary | ICD-10-CM | POA: Diagnosis not present

## 2016-08-30 DIAGNOSIS — H524 Presbyopia: Secondary | ICD-10-CM | POA: Diagnosis not present

## 2016-08-30 LAB — HM DIABETES EYE EXAM

## 2016-09-05 ENCOUNTER — Telehealth: Payer: Self-pay | Admitting: *Deleted

## 2016-09-05 NOTE — Telephone Encounter (Signed)
I would try to gradually increase the gabapentin up to 300mg  3 times a day, sedation caution.  That may help the quickest with the pain.  If not better soon, then let me know.  Thanks.

## 2016-09-05 NOTE — Telephone Encounter (Signed)
Spoke to pt. He will let us know if he is not getting any better

## 2016-09-05 NOTE — Telephone Encounter (Signed)
Patient left a voicemail stating that he has recently been diagnosed with diabetes and neuropathy. Patient stated that this weekend was really bad and wants to know if you can prescribe something else for him? Pharmacy [facility]

## 2016-09-05 NOTE — Telephone Encounter (Signed)
How is his sugar?  How much gabapentin is he taking? What kind of symptoms did he have this weekend? Please let me know.   Thanks.

## 2016-09-05 NOTE — Telephone Encounter (Signed)
Blood sugars have been 120s. Highest in the 140s usually 2 hours after eating. FBS is no higher than 120.   He is taking 1 100mg  three times a day. He says the pain has gotten 10x worse. A lot more shooting pain.

## 2016-09-12 ENCOUNTER — Other Ambulatory Visit: Payer: Self-pay

## 2016-09-12 MED ORDER — GABAPENTIN 300 MG PO CAPS: 300.0000 mg | ORAL_CAPSULE | Freq: Three times a day (TID) | ORAL | 1 refills | Status: DC

## 2016-09-12 NOTE — Telephone Encounter (Signed)
Pt is taking gabapentin 100 mg; taking 3 caps tid. Pt request 90 day refill [facility] That quantity would be # [phone].Please advise.  Pt last seen 07/29/16.

## 2016-09-12 NOTE — Telephone Encounter (Signed)
Sent.  Changed to 300mg  tab, 1 tid.  # of pills adjusted.  Thanks.

## 2016-09-13 NOTE — Telephone Encounter (Signed)
Patient advised.

## 2016-10-07 ENCOUNTER — Other Ambulatory Visit: Payer: Self-pay | Admitting: Family Medicine

## 2016-10-07 ENCOUNTER — Other Ambulatory Visit: Payer: BLUE CROSS/BLUE SHIELD

## 2016-10-07 ENCOUNTER — Other Ambulatory Visit (INDEPENDENT_AMBULATORY_CARE_PROVIDER_SITE_OTHER): Payer: BLUE CROSS/BLUE SHIELD

## 2016-10-07 DIAGNOSIS — E1149 Type 2 diabetes mellitus with other diabetic neurological complication: Secondary | ICD-10-CM | POA: Diagnosis not present

## 2016-10-07 LAB — BASIC METABOLIC PANEL
BUN: 22 mg/dL (ref 6–23)
CHLORIDE: 104 meq/L (ref 96–112)
CO2: 28 mEq/L (ref 19–32)
Calcium: 9.5 mg/dL (ref 8.4–10.5)
Creatinine, Ser: 0.86 mg/dL (ref 0.40–1.50)
GFR: 97.43 mL/min (ref >60.00)
Glucose, Bld: 133 mg/dL — ABNORMAL HIGH (ref 70–99)
POTASSIUM: 4 meq/L (ref 3.5–5.1)
Sodium: 140 mEq/L (ref 135–145)

## 2016-10-07 LAB — HEMOGLOBIN A1C: HEMOGLOBIN A1C: 6.3 % (ref 4.6–6.5)

## 2016-10-11 ENCOUNTER — Encounter: Payer: Self-pay | Admitting: Family Medicine

## 2016-10-11 ENCOUNTER — Ambulatory Visit (INDEPENDENT_AMBULATORY_CARE_PROVIDER_SITE_OTHER): Payer: BLUE CROSS/BLUE SHIELD | Admitting: Family Medicine

## 2016-10-11 VITALS — BP 110/76 | HR 74 | Temp 97.9°F | Wt 198.5 lb

## 2016-10-11 DIAGNOSIS — E1149 Type 2 diabetes mellitus with other diabetic neurological complication: Secondary | ICD-10-CM | POA: Diagnosis not present

## 2016-10-11 DIAGNOSIS — G609 Hereditary and idiopathic neuropathy, unspecified: Secondary | ICD-10-CM | POA: Diagnosis not present

## 2016-10-11 LAB — TSH: TSH: 1.51 u[IU]/mL (ref 0.35–4.50)

## 2016-10-11 LAB — VITAMIN B12: Vitamin B-12: >1500 pg/mL — ABNORMAL HIGH (ref 211–911)

## 2016-10-11 MED ORDER — PREGABALIN 75 MG PO CAPS: 75.0000 mg | ORAL_CAPSULE | Freq: Two times a day (BID) | ORAL | 2 refills | Status: DC

## 2016-10-11 NOTE — Progress Notes (Signed)
Pre visit review using our clinic review tool, if applicable. No additional management support is needed unless otherwise documented below in the visit note. 

## 2016-10-11 NOTE — Assessment & Plan Note (Addendum)
See notes on labs regarding neuropathy with B12 and TSH. His A1c is much improved but his feet are no better. In the meantime taper off gabapentin and start Lyrica. See after visit summary. Routine cautions given to patient. A1c much improved. I think patient for his effort. Recheck A1c in a few months. He will update me about his feet once he has started Lyrica in the meantime. Okay for outpatient follow-up.

## 2016-10-11 NOTE — Patient Instructions (Signed)
Stop gabapentin with a taper.  Take 2 pills a day for 3 days, then then 1 pill a day for 3 days.   Then start lyrica.  Start 1 a day, can increase to 1 twice a day 1 week later if needed/tolerated.  Go to the lab on the way out.  We'll contact you with your lab report. Plan on recheck in about 3 months, labs ahead of time.  Take care.  Glad to see you.  If your feet are not better on twice daily lyrica, then let me know.

## 2016-10-11 NOTE — Progress Notes (Signed)
Diabetes:  Using medications without difficulties:yes Hypoglycemic episodes:no Hyperglycemic episodes:no Feet problems:yes Blood Sugars averaging: ~120s usually, higher in the AMs eye exam within last year:yes  Still with burning foot pain, some numbness.  Sleep is disrupted.  Tried gabapentin 900mg  tid.  No ADE on med but not relief.   A1c much improved, labs d/w pt.   He has been working on diet and exercise (as tolerated with foot pain).  Weight is down  Meds, vitals, and allergies reviewed.   ROS: Per HPI unless specifically indicated in ROS section   GEN: nad, alert and oriented HEENT: mucous membranes moist NECK: supple w/o LA CV: rrr. PULM: ctab, no inc wob ABD: soft, +bs EXT: no edema

## 2016-10-12 ENCOUNTER — Telehealth: Payer: Self-pay

## 2016-10-12 NOTE — Telephone Encounter (Signed)
Pt called for status of Lyrica rx that was sent in 10/11/16. I spoke with pt and he found his Lyrica prescription; nothing further needed.

## 2016-10-26 ENCOUNTER — Ambulatory Visit (INDEPENDENT_AMBULATORY_CARE_PROVIDER_SITE_OTHER): Payer: BLUE CROSS/BLUE SHIELD | Admitting: Family Medicine

## 2016-10-26 ENCOUNTER — Encounter: Payer: Self-pay | Admitting: Family Medicine

## 2016-10-26 VITALS — BP 130/84 | HR 92 | Temp 98.1°F | Wt 196.0 lb

## 2016-10-26 DIAGNOSIS — L989 Disorder of the skin and subcutaneous tissue, unspecified: Secondary | ICD-10-CM | POA: Diagnosis not present

## 2016-10-26 DIAGNOSIS — E1149 Type 2 diabetes mellitus with other diabetic neurological complication: Secondary | ICD-10-CM | POA: Diagnosis not present

## 2016-10-26 MED ORDER — PREGABALIN 75 MG PO CAPS: 75.0000 mg | ORAL_CAPSULE | Freq: Three times a day (TID) | ORAL | 1 refills | Status: DC

## 2016-10-26 NOTE — Progress Notes (Signed)
Pre visit review using our clinic review tool, if applicable. No additional management support is needed unless otherwise documented below in the visit note. 

## 2016-10-26 NOTE — Assessment & Plan Note (Signed)
Continue Lyrica. Okay to take up to 3 tablets per day. He can try taking 1 tablet 3 times a day versus 1 tablet in the morning and 2 tablets at night. If he still has trouble sleeping he can try 25 mg of Benadryl at night. He will update me as needed. Routine cautions given on medication. Hardcopy printed and given to patient. He agrees with plan. Continue work on diabetes as he has been doing.

## 2016-10-26 NOTE — Assessment & Plan Note (Signed)
Incidental finding. Likely a benign seborrheic keratosis. See above. Treated successfully with liquid nitrogen 3 without complication. Routine cautions discussed with patient.

## 2016-10-26 NOTE — Progress Notes (Signed)
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  B12 and tsh wnl.  Changed to lyrica.  He had some inc in pain with the gabapentin taper, meaning the medicine was likely having some effect.  In the meantime, started lyrica.  Was initially fatigued but that is getting better.  Is able to tolerate the med.  Was able to tolerate higher dose, bid dosing now.  Pain returns at ~12 hours right before next dose of med.  lyrica likely helps more than the gabapentin, per patient report.  Has been on BID dosing for about 10 days.    He has stopped drinking etoh.    Meds, vitals, and allergies reviewed.   ROS: Per HPI unless specifically indicated in ROS section   nad ncat rrr ctab He has varicose veins on B shins, not ttp  Diabetic foot exam: Normal inspection No skin breakdown No calluses  Normal DP pulses Normal sensation to light touch but dec sensation to monofilament on distal toes Nails normal  He has a small skin lesion noted on the left posterior thigh. Smaller than a centimeter across. Does not appear infected. Looks like a small irritated seborrheic keratosis. Per patient it is been present there for months. It will occasionally flake off. It is not ulcerated. Has not been bleeding. Discussed with patient about options. Given that it is so small, it would likely be best to treat with liquid nitrogen since it looks to be an irritated seborrheic keratosis. Discussed with patient that we would not be able to get pathology sample for analysis but that this was still most likely the least invasive and most effective way to get this spot treated. Even if he did potentially have some other process such as a very small squamous cell cancer, treatment with liquid nitrogen for lesion so small would likely be more than adequate. He agreed. He consented. Frozen 3 with liquid nitrogen. No complication.

## 2016-10-26 NOTE — Patient Instructions (Signed)
Take lyrica, either 1 tab 3 times a day for 1 in the in AM and 2 in the PM.   Benadryl if needed at night for sleep, 25mg .   Update me as needed.  Take care.  Glad to see you.

## 2016-11-16 ENCOUNTER — Other Ambulatory Visit: Payer: Self-pay | Admitting: *Deleted

## 2016-11-16 NOTE — Telephone Encounter (Signed)
Received fax from [place] requesting Rx for Lyrica.  Looks like a prescription was printed and given to patient on 10/26/2016 when patient was here for Office Visit.Marland Kitchen  Refill?

## 2016-11-17 NOTE — Telephone Encounter (Signed)
Yes, I thought printed and given to patient.  Please check with patient. Thanks.

## 2016-11-17 NOTE — Telephone Encounter (Signed)
Called home # and family advised me that pt is at work and he will be home closer to [place]

## 2016-11-18 ENCOUNTER — Other Ambulatory Visit: Payer: Self-pay | Admitting: *Deleted

## 2016-11-18 DIAGNOSIS — I10 Essential (primary) hypertension: Secondary | ICD-10-CM

## 2016-11-18 MED ORDER — LISINOPRIL 20 MG PO TABS: 20.0000 mg | ORAL_TABLET | Freq: Every day | ORAL | 3 refills | Status: DC

## 2016-11-18 MED ORDER — METFORMIN HCL 1000 MG PO TABS: 1000.0000 mg | ORAL_TABLET | Freq: Two times a day (BID) | ORAL | 3 refills | Status: DC

## 2016-11-21 NOTE — Telephone Encounter (Signed)
Spoke to patient by telephone and was advised that he was given a written script and turned it in to a local pharmacy and he has several refills left. Advised patient when he runs out of refills to call the office and request that a refill be sent to [facility], but to allow at least 2 weeks for mail order. Patient verbalized understanding.

## 2016-11-22 ENCOUNTER — Encounter: Payer: Self-pay | Admitting: Family Medicine

## 2016-11-22 ENCOUNTER — Ambulatory Visit (INDEPENDENT_AMBULATORY_CARE_PROVIDER_SITE_OTHER)
Admission: RE | Admit: 2016-11-22 | Discharge: 2016-11-22 | Disposition: A | Discharge status: Home / Self Care | Payer: BLUE CROSS/BLUE SHIELD | Source: Ambulatory Visit | Attending: Family Medicine | Admitting: Family Medicine

## 2016-11-22 ENCOUNTER — Ambulatory Visit (INDEPENDENT_AMBULATORY_CARE_PROVIDER_SITE_OTHER): Payer: BLUE CROSS/BLUE SHIELD | Admitting: Family Medicine

## 2016-11-22 VITALS — BP 120/82 | HR 80 | Temp 98.4°F | Wt 192.0 lb

## 2016-11-22 DIAGNOSIS — M47816 Spondylosis without myelopathy or radiculopathy, lumbar region: Secondary | ICD-10-CM | POA: Diagnosis not present

## 2016-11-22 DIAGNOSIS — M79604 Pain in right leg: Secondary | ICD-10-CM

## 2016-11-22 MED ORDER — AMITRIPTYLINE HCL 10 MG PO TABS: 10.0000 mg | ORAL_TABLET | Freq: Every day | ORAL | 1 refills | Status: DC

## 2016-11-22 NOTE — Progress Notes (Signed)
lyrica dosed 3 tabs per day, 1 in the AM, 2 in the PM.  "I can't tell how bad it would be without it but I don't want to find out."    Sig improvement in A1c didn't help his leg pain.  He can't sleep from leg pain.    Burning pain in the legs, esp severe in R shin. Highly sensitive to pain locally.  Now with more R upper leg and hip pain.  No fevers.    This feels different from the stocking and glove pain/neuropathy changes.  H/o DM2, now controlled.    PMH and SH reviewed  ROS: Per HPI unless specifically indicated in ROS section   Meds, vitals, and allergies reviewed.   nad ncat rrr ctab Back not ttp in midline No CVA pain No rash, no bruising Normal ROM R hip and R SLR neg.  He has some weakness in R hip flexion at baseline, slightly worse due to pain on exam today but not a new weakness.  Strength wnl BLE w/o.  2+ DTRs B patella Sig pain on R shin with light touch.  Slightly dec sensation in the B feet at baseline

## 2016-11-22 NOTE — Progress Notes (Signed)
Pre visit review using our clinic review tool, if applicable. No additional management support is needed unless otherwise documented below in the visit note. 

## 2016-11-22 NOTE — Patient Instructions (Signed)
Go to the lab on the way out.  We'll contact you with your xray report. Add on amitriptyline at night, start with 1 tab and gradually increase to 3 tabs at night.  We'll go from there. Update me if not any better.  Take care.  Glad to see you.

## 2016-11-23 ENCOUNTER — Encounter: Payer: Self-pay | Admitting: Family Medicine

## 2016-11-23 DIAGNOSIS — M79604 Pain in right leg: Secondary | ICD-10-CM | POA: Insufficient documentation

## 2016-11-23 NOTE — Assessment & Plan Note (Signed)
Likely a separate issue from the diabetic stocking/glove issues.   Initial plan: check back films, continue lyrica, add on TCA and gradually in dose with routine cautions, see AVS.   Additionally- back films with 1. Mild compressions of T9, T10, age undetermined. T11 compression fracture is most likely old . 2. Diffuse degenerative change. Refer to ortho.  See result notes.

## 2016-11-28 ENCOUNTER — Telehealth: Payer: Self-pay | Admitting: Family Medicine

## 2016-11-28 NOTE — Telephone Encounter (Signed)
Opened in error

## 2016-12-02 ENCOUNTER — Telehealth: Payer: Self-pay

## 2016-12-02 NOTE — Telephone Encounter (Signed)
Patient returned Shannon's call.  Patient will pick up rx today from the local pharmacy.  His next rx will be need to be sent to [facility] for a 90 day supply.

## 2016-12-02 NOTE — Telephone Encounter (Signed)
We received a fax request from [place] stating pt wanted his Lyrica sent to mail order. I left a message for the pt to call let us know if he wants it at the mail order or keep at the local pharmacy as it is right now.

## 2016-12-07 DIAGNOSIS — M898X6 Other specified disorders of bone, lower leg: Secondary | ICD-10-CM | POA: Diagnosis not present

## 2016-12-26 ENCOUNTER — Other Ambulatory Visit: Payer: Self-pay | Admitting: *Deleted

## 2016-12-26 MED ORDER — PREGABALIN 75 MG PO CAPS: 75.0000 mg | ORAL_CAPSULE | Freq: Three times a day (TID) | ORAL | 1 refills | Status: DC

## 2016-12-26 NOTE — Telephone Encounter (Signed)
Printed. Please fax to mail order. Thanks.

## 2016-12-26 NOTE — Telephone Encounter (Signed)
Last filled 10/26/2016 #90 1R. Last f/u 10/2016. Pt is requesting to have medication sent to mail order pharmacy but has not updated contract for new pharmacy. (see 12/02/16 phone note) pls advise

## 2016-12-27 NOTE — Telephone Encounter (Signed)
Rx faxed to mail order pharmacy. 

## 2017-02-18 ENCOUNTER — Other Ambulatory Visit: Payer: Self-pay | Admitting: Family Medicine

## 2017-02-20 NOTE — Telephone Encounter (Signed)
Sent. Thanks.   

## 2017-02-20 NOTE — Telephone Encounter (Signed)
Electronic refill request. Last office visit:   10/26/2016 Last Filled:    90 tablet 1 11/22/2016  Please advise.

## 2017-02-27 ENCOUNTER — Other Ambulatory Visit: Payer: Self-pay | Admitting: *Deleted

## 2017-02-27 MED ORDER — AMITRIPTYLINE HCL 10 MG PO TABS: 10.0000 mg | ORAL_TABLET | Freq: Every day | ORAL | 3 refills | Status: DC

## 2017-02-27 NOTE — Telephone Encounter (Signed)
Faxed refill request. Last office visit:

## 2017-02-27 NOTE — Telephone Encounter (Signed)
Faxed refill request.  Last office visit:   11/22/16 Last Filled:    90 tablet 3 02/20/2017  To [facility] in [place].  Rx now requested from [facility].  Please advise.

## 2017-02-27 NOTE — Telephone Encounter (Signed)
Sent. Thanks.   

## 2017-06-19 ENCOUNTER — Other Ambulatory Visit: Payer: Self-pay | Admitting: *Deleted

## 2017-06-19 MED ORDER — PREGABALIN 75 MG PO CAPS: 75.0000 mg | ORAL_CAPSULE | Freq: Three times a day (TID) | ORAL | 0 refills | Status: DC

## 2017-06-19 NOTE — Telephone Encounter (Signed)
Faxed refill request. Lyrica Last office visit:   11/22/2016 Last Filled:    270 capsule 1 12/26/2016  Please advise.

## 2017-06-19 NOTE — Telephone Encounter (Signed)
Needs fasting lipids prior to OV.  Printed to fax in to mail order.  Thanks.

## 2017-06-20 ENCOUNTER — Other Ambulatory Visit (INDEPENDENT_AMBULATORY_CARE_PROVIDER_SITE_OTHER): Payer: BLUE CROSS/BLUE SHIELD

## 2017-06-20 ENCOUNTER — Other Ambulatory Visit: Payer: Self-pay | Admitting: Family Medicine

## 2017-06-20 DIAGNOSIS — E1149 Type 2 diabetes mellitus with other diabetic neurological complication: Secondary | ICD-10-CM

## 2017-06-20 NOTE — Telephone Encounter (Signed)
Patient left a voicemail stating that he is trying to get a refill on his Lyrica. Patient stated that he is running low on the medication. Patient stated that script needs to go to [facility].

## 2017-06-20 NOTE — Telephone Encounter (Signed)
Patient advised.  Rx faxed to mail order pharmacy.

## 2017-06-21 LAB — HEMOGLOBIN A1C: Hgb A1c MFr Bld: 6 % (ref 4.6–6.5)

## 2017-06-21 LAB — COMPREHENSIVE METABOLIC PANEL
ALT: 23 U/L (ref 0–53)
AST: 21 U/L (ref 0–37)
Albumin: 4.7 g/dL (ref 3.5–5.2)
Alkaline Phosphatase: 59 U/L (ref 39–117)
BUN: 20 mg/dL (ref 6–23)
CO2: 29 meq/L (ref 19–32)
Calcium: 10.3 mg/dL (ref 8.4–10.5)
Chloride: 101 mEq/L (ref 96–112)
Creatinine, Ser: 0.78 mg/dL (ref 0.40–1.50)
GFR: 108.78 mL/min (ref >60.00)
GLUCOSE: 101 mg/dL — AB (ref 70–99)
POTASSIUM: 4.7 meq/L (ref 3.5–5.1)
Sodium: 136 mEq/L (ref 135–145)
Total Bilirubin: 0.4 mg/dL (ref 0.2–1.2)
Total Protein: 7.9 g/dL (ref 6.0–8.3)

## 2017-06-21 LAB — LIPID PANEL
Cholesterol: 179 mg/dL (ref 0–200)
HDL: 49.4 mg/dL (ref >39.00)
LDL CALC: 104 mg/dL — AB (ref 0–99)
NonHDL: 129.87
TRIGLYCERIDES: 130 mg/dL (ref 0.0–149.0)
Total CHOL/HDL Ratio: 4
VLDL: 26 mg/dL (ref 0.0–40.0)

## 2017-06-27 ENCOUNTER — Other Ambulatory Visit: Payer: BLUE CROSS/BLUE SHIELD

## 2017-06-27 ENCOUNTER — Encounter: Payer: Self-pay | Admitting: Family Medicine

## 2017-06-27 ENCOUNTER — Ambulatory Visit (INDEPENDENT_AMBULATORY_CARE_PROVIDER_SITE_OTHER): Payer: BLUE CROSS/BLUE SHIELD | Admitting: Family Medicine

## 2017-06-27 VITALS — BP 128/78 | HR 80 | Temp 98.4°F | Wt 235.0 lb

## 2017-06-27 DIAGNOSIS — E1149 Type 2 diabetes mellitus with other diabetic neurological complication: Secondary | ICD-10-CM | POA: Diagnosis not present

## 2017-06-27 DIAGNOSIS — Z1159 Encounter for screening for other viral diseases: Secondary | ICD-10-CM | POA: Diagnosis not present

## 2017-06-27 DIAGNOSIS — Z23 Encounter for immunization: Secondary | ICD-10-CM | POA: Diagnosis not present

## 2017-06-27 DIAGNOSIS — Z114 Encounter for screening for human immunodeficiency virus [HIV]: Secondary | ICD-10-CM | POA: Diagnosis not present

## 2017-06-27 DIAGNOSIS — T63481A Toxic effect of venom of other arthropod, accidental (unintentional), initial encounter: Secondary | ICD-10-CM | POA: Diagnosis not present

## 2017-06-27 DIAGNOSIS — E119 Type 2 diabetes mellitus without complications: Secondary | ICD-10-CM

## 2017-06-27 NOTE — Progress Notes (Signed)
R facial/maxillary local swelling after hornet sting.  Local changes.  No systemic sx.  Used benadryl.  Improving.  No lip or tongue swelling.  No wheeze.    Diabetes:  Using medications without difficulties: yes Hypoglycemic episodes:no Hyperglycemic episodes:no Feet problems: see below, with neuropathy.   Blood Sugars averaging: usually ~120, up to about 2 hours 140 after eating.   eye exam within last year: yes A1c 6.   Weight gain noted in the meantime but he had been trying to maintain leaner weight.  Still down from prev max weight, d/w pt.  Still on lyrica, likely causing some weight gain but it helped with the pain.   ASCVD score d/w pt.  ~13% currently.  Not on statin yet, d/w pt. Would be ~7% if not diabetic.    Flu shot to be done at work.   Tetanus d/w pt.   Done at [place].  Pt opts in for HIV screening.  D/w pt re: routine screening.   Pt opts in for HCV screening.  D/w pt re: routine screening.    PMH and SH reviewed  Meds, vitals, and allergies reviewed.   ROS: Per HPI unless specifically indicated in ROS section   GEN: nad, alert and oriented HEENT: mucous membranes moist NECK: supple w/o LA CV: rrr. PULM: ctab, no inc wob ABD: soft, +bs EXT: no edema SKIN: no acute rash except for local irritation on the R maxillary area.   Diabetic foot exam: Normal inspection No skin breakdown No calluses  Normal DP pulses Dec sensation to light touch and monofilament on the toes Nails normal

## 2017-06-27 NOTE — Patient Instructions (Signed)
Recheck labs in about 6 months prior to a physical.  Keep working on your weight and diet.  Take care.  Glad to see you.  Let us know when you get a flu shot.

## 2017-06-28 DIAGNOSIS — Z23 Encounter for immunization: Secondary | ICD-10-CM | POA: Insufficient documentation

## 2017-06-28 DIAGNOSIS — T63481A Toxic effect of venom of other arthropod, accidental (unintentional), initial encounter: Secondary | ICD-10-CM | POA: Insufficient documentation

## 2017-06-28 DIAGNOSIS — Z1159 Encounter for screening for other viral diseases: Secondary | ICD-10-CM | POA: Insufficient documentation

## 2017-06-28 DIAGNOSIS — Z0001 Encounter for general adult medical examination with abnormal findings: Secondary | ICD-10-CM | POA: Insufficient documentation

## 2017-06-28 NOTE — Assessment & Plan Note (Signed)
Discussed with patient about routine screening

## 2017-06-28 NOTE — Assessment & Plan Note (Signed)
Local symptoms only. Okay for outpatient follow-up.

## 2017-06-28 NOTE — Assessment & Plan Note (Signed)
Done at [facility]

## 2017-06-28 NOTE — Assessment & Plan Note (Signed)
Continue work on diet and exercise. No change in meds at this point. Recheck labs in about 6 months. If his A1c is lower weekend likely start to taper his medications. ASCVD score d/w pt.  ~13% currently.  Not on statin yet, d/w pt. Would be ~7% if not diabetic.  We talked about starting a statin but moving out of the diabetic range would be likely the most important step. Discussed with patient. He agrees. >25 minutes spent in face to face time with patient, >50% spent in counselling or coordination of care.

## 2017-08-03 IMAGING — DX DG LUMBAR SPINE COMPLETE 4+V
5 series · 5 of 5 positions shown · non-contrast
Comparison: 03/31/2013.  03/19/2013.

CLINICAL DATA: Right leg pain.

EXAM:
LUMBAR SPINE - COMPLETE 4+ VIEW

[l-spine ap]
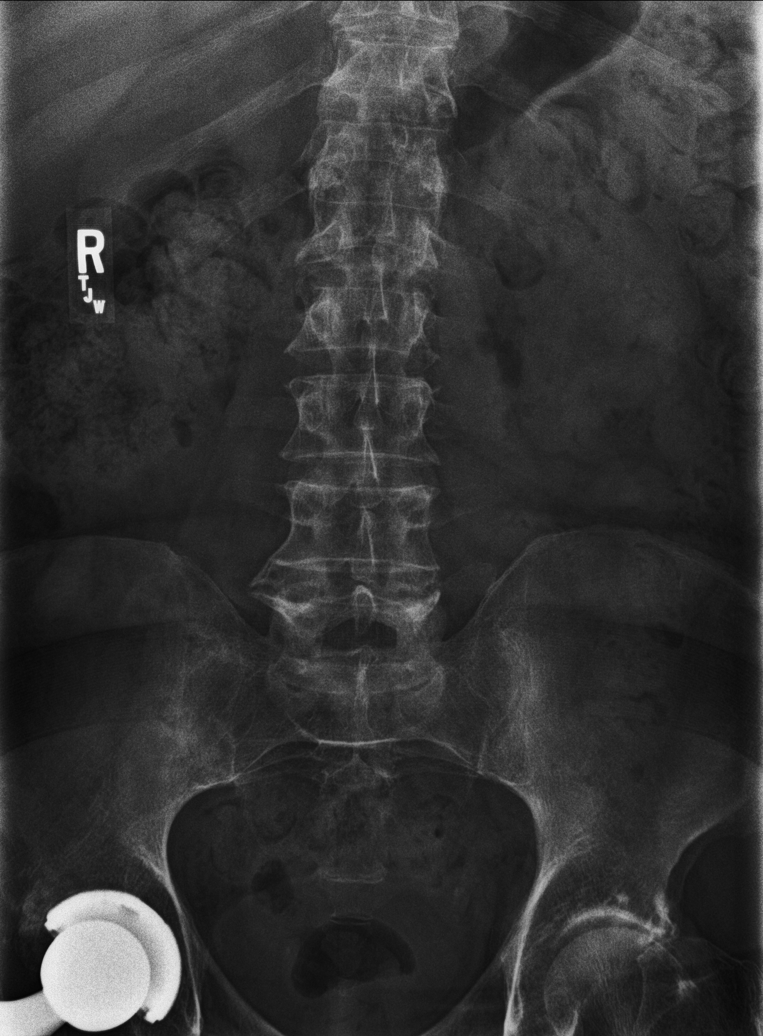

[l-spine obl (1 of 2)]
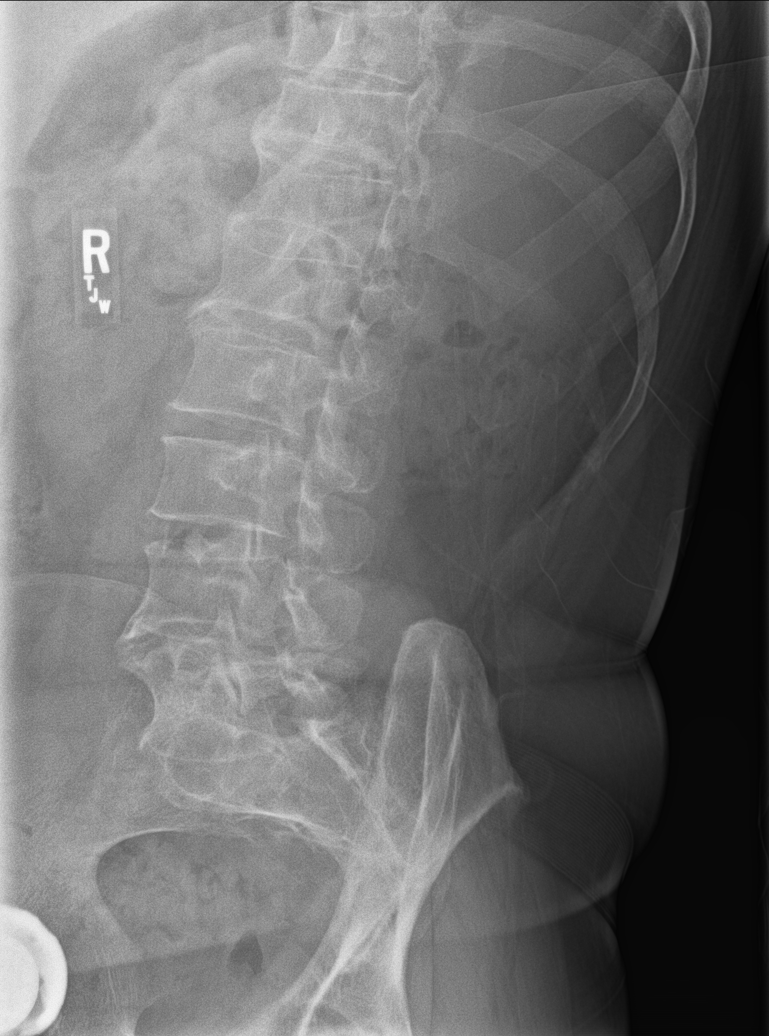

[l-spine obl (2 of 2)]
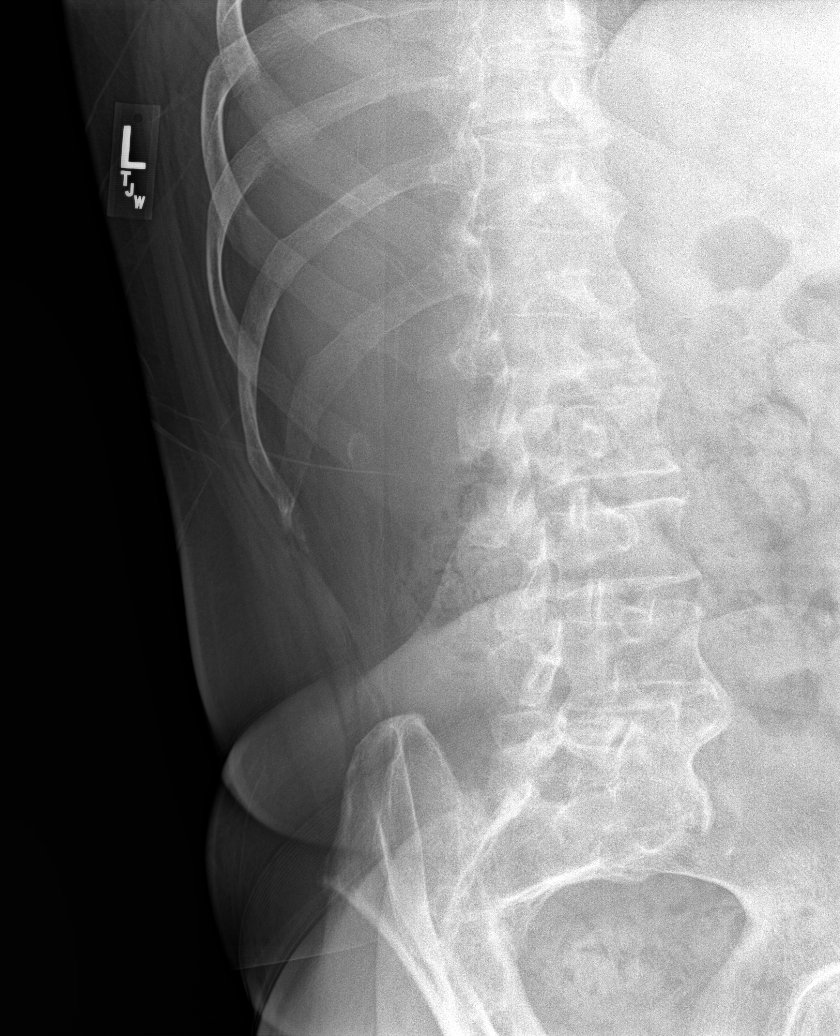

[l-spine lat]
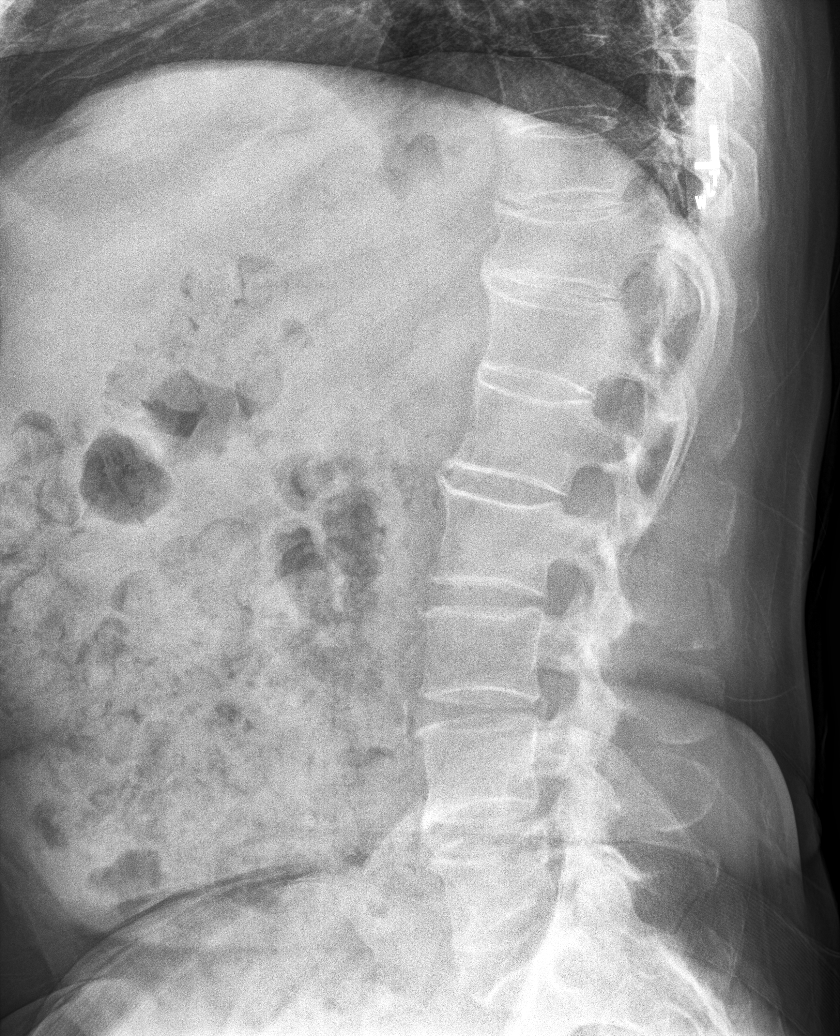

[l-spine l5/s1]
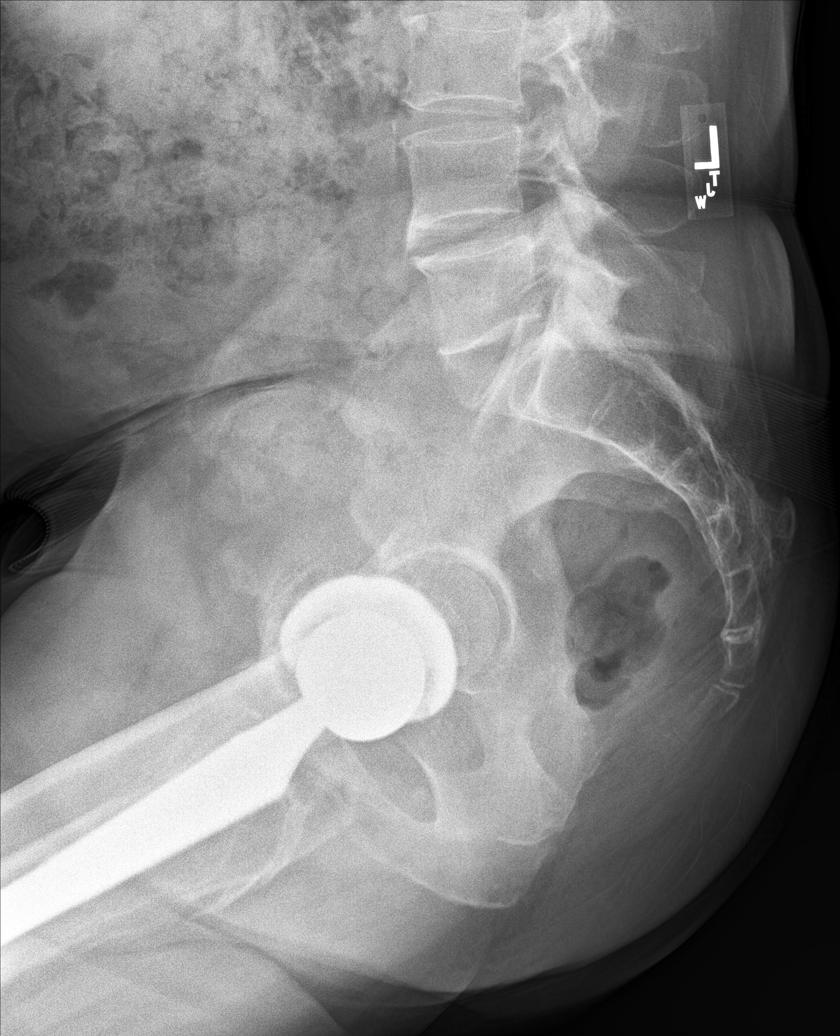

[5 of 5 positions shown; findings below may reference images not displayed]

FINDINGS: Degenerative changes lumbar spine and left hip. Mild compression of
T9, T10,, age undetermined. T11 compression fractures most likely
old. Total right hip replacement. Aortoiliac atherosclerotic
vascular calcification.
IMPRESSION: 1. Mild compressions of T9, T10, age undetermined. T11 compression
fracture is most likely old .

2. Diffuse degenerative change.

3.Total right hip replacement.

4. Aortoiliac atherosclerotic vascular disease.

## 2017-09-18 ENCOUNTER — Other Ambulatory Visit: Payer: Self-pay | Admitting: Family Medicine

## 2017-09-19 NOTE — Telephone Encounter (Signed)
Electronic refill request. Pregabalin Last office visit:   06/27/17 Last Filled:    270 capsule 0 06/19/2017  Please advise.

## 2017-09-20 MED ORDER — PREGABALIN 75 MG PO CAPS: 75.0000 mg | ORAL_CAPSULE | Freq: Three times a day (TID) | ORAL | 1 refills | Status: DC

## 2017-09-20 NOTE — Telephone Encounter (Signed)
Rx faxed to pharmacy as instructed. 

## 2017-09-20 NOTE — Telephone Encounter (Signed)
Printed to send in.  Thanks.

## 2017-09-25 ENCOUNTER — Other Ambulatory Visit: Payer: Self-pay | Admitting: *Deleted

## 2017-11-02 ENCOUNTER — Other Ambulatory Visit: Payer: Self-pay | Admitting: Family Medicine

## 2017-11-02 DIAGNOSIS — I10 Essential (primary) hypertension: Secondary | ICD-10-CM

## 2017-11-19 ENCOUNTER — Other Ambulatory Visit: Payer: Self-pay | Admitting: Family Medicine

## 2017-12-25 ENCOUNTER — Ambulatory Visit (INDEPENDENT_AMBULATORY_CARE_PROVIDER_SITE_OTHER): Payer: BLUE CROSS/BLUE SHIELD | Admitting: Family Medicine

## 2017-12-25 DIAGNOSIS — E119 Type 2 diabetes mellitus without complications: Secondary | ICD-10-CM

## 2017-12-25 DIAGNOSIS — Z114 Encounter for screening for human immunodeficiency virus [HIV]: Secondary | ICD-10-CM

## 2017-12-25 DIAGNOSIS — Z1159 Encounter for screening for other viral diseases: Secondary | ICD-10-CM

## 2017-12-25 NOTE — Addendum Note (Signed)
Addended by: Ellamae Sia on: 12/25/2017 03:56 PM   Modules accepted: Orders

## 2017-12-26 LAB — COMPREHENSIVE METABOLIC PANEL
ALK PHOS: 84 U/L (ref 39–117)
ALT: 43 U/L (ref 0–53)
AST: 30 U/L (ref 0–37)
Albumin: 4.2 g/dL (ref 3.5–5.2)
BILIRUBIN TOTAL: 0.4 mg/dL (ref 0.2–1.2)
BUN: 16 mg/dL (ref 6–23)
CALCIUM: 9.8 mg/dL (ref 8.4–10.5)
CO2: 26 meq/L (ref 19–32)
Chloride: 103 mEq/L (ref 96–112)
Creatinine, Ser: 0.85 mg/dL (ref 0.40–1.50)
GFR: 98.33 mL/min (ref >60.00)
Glucose, Bld: 164 mg/dL — ABNORMAL HIGH (ref 70–99)
POTASSIUM: 4.3 meq/L (ref 3.5–5.1)
Sodium: 137 mEq/L (ref 135–145)
TOTAL PROTEIN: 7.7 g/dL (ref 6.0–8.3)

## 2017-12-26 LAB — HEMOGLOBIN A1C: Hgb A1c MFr Bld: 6.9 % — ABNORMAL HIGH (ref 4.6–6.5)

## 2017-12-26 LAB — LDL CHOLESTEROL, DIRECT: Direct LDL: 94 mg/dL

## 2017-12-26 LAB — LIPID PANEL
CHOL/HDL RATIO: 4
CHOLESTEROL: 155 mg/dL (ref 0–200)
HDL: 36.7 mg/dL — AB (ref >39.00)
NonHDL: 117.92
TRIGLYCERIDES: 223 mg/dL — AB (ref 0.0–149.0)
VLDL: 44.6 mg/dL — AB (ref 0.0–40.0)

## 2017-12-26 LAB — HIV ANTIBODY (ROUTINE TESTING W REFLEX): HIV 1&2 Ab, 4th Generation: NONREACTIVE

## 2017-12-26 LAB — HEPATITIS C ANTIBODY
Hepatitis C Ab: NONREACTIVE
SIGNAL TO CUT-OFF: 0.03 (ref <1.00)

## 2017-12-26 NOTE — Progress Notes (Signed)
See order(s).

## 2017-12-28 ENCOUNTER — Ambulatory Visit (INDEPENDENT_AMBULATORY_CARE_PROVIDER_SITE_OTHER): Payer: BLUE CROSS/BLUE SHIELD | Admitting: Family Medicine

## 2017-12-28 ENCOUNTER — Encounter: Payer: Self-pay | Admitting: Family Medicine

## 2017-12-28 VITALS — BP 124/84 | HR 91 | Temp 98.2°F | Ht 69.0 in | Wt 252.0 lb

## 2017-12-28 DIAGNOSIS — Z0001 Encounter for general adult medical examination with abnormal findings: Secondary | ICD-10-CM | POA: Diagnosis not present

## 2017-12-28 DIAGNOSIS — G4733 Obstructive sleep apnea (adult) (pediatric): Secondary | ICD-10-CM

## 2017-12-28 DIAGNOSIS — E1149 Type 2 diabetes mellitus with other diabetic neurological complication: Secondary | ICD-10-CM

## 2017-12-28 DIAGNOSIS — Z7189 Other specified counseling: Secondary | ICD-10-CM

## 2017-12-28 DIAGNOSIS — R1031 Right lower quadrant pain: Secondary | ICD-10-CM

## 2017-12-28 DIAGNOSIS — Z1211 Encounter for screening for malignant neoplasm of colon: Secondary | ICD-10-CM

## 2017-12-28 DIAGNOSIS — Z9989 Dependence on other enabling machines and devices: Secondary | ICD-10-CM

## 2017-12-28 DIAGNOSIS — I10 Essential (primary) hypertension: Secondary | ICD-10-CM

## 2017-12-28 MED ORDER — GABAPENTIN 300 MG PO CAPS: 300.0000 mg | ORAL_CAPSULE | Freq: Three times a day (TID) | ORAL | 3 refills | Status: DC | PRN

## 2017-12-28 NOTE — Patient Instructions (Addendum)
Ivan Mcguire will call about your referral. Take care.  Glad to see you.  Call about an eye exam when you can.   Recheck A1c in about 3 months.  Taper Lyrica down to 2 tabs for about 1 week.   Then try to take 1 lyrica a day and start gabapentin.   Try to stop the lyrica after taking 1 pill a day for about 1 week.  Start gabapentin 300mg  a day initially, then gradually work up to twice, then three times per day.   Update me as you go along.   Try plain vaseline on your ears.  Use a small amount of hydrocortisone if needed, if not better.  Don't put vaseline on top of hydrocortisone.

## 2017-12-28 NOTE — Progress Notes (Signed)
CPE- See plan.  Routine anticipatory guidance given to patient.  See health maintenance.  The possibility exists that previously documented standard health maintenance information may have been brought forward from a previous encounter into this note.  If needed, that same information has been updated to reflect the current situation based on today's encounter.    Tetanus 2018 PNA 2017 Shingles d/w pt.  Defer to 60.   Flu shot done at work.   D/w patient GL:[id number] for colon cancer screening, including IFOB vs. colonoscopy.  Risks and benefits of both were discussed and patient voiced understanding.  Pt elects IRJ:JOAC.  Prostate cancer screening and PSA options (with potential risks and benefits of testing vs not testing) were discussed along with recent recs/guidelines.  He declined testing PSA at this point. Living will d/w pt.  Wife designated if patient were incapacitated.    Diabetes:  Using medications without difficulties: yes Hypoglycemic episodes:no Hyperglycemic episodes:no Feet problems: at baseline, see below.   Blood Sugars averaging: ~140s eye exam within last year: due, d/w pt.   He tried taper of lyrica but the pain was worse on lower dose.  D/w pt.  We talked about trial of gabapentin trial again.   A1c up some compared to prev.   It may be that lyrica is driving his appetite up and that is affecting his weight and A1c.   Taking amitriptyline 20mg  at night with some relief.    Hypertension:    Using medication without problems or lightheadedness: yes Chest pain with exertion:no Edema:no Short of breath:no Labs d/w pt.    OSA.  Needs recheck re: settings.  D/w pt.  Referred.    He still has some groin pain on R side.  Noted with a cough.  He felt a small lump locally.  No L sided sx.    PMH and SH reviewed  Meds, vitals, and allergies reviewed.   ROS: Per HPI.  Unless specifically indicated otherwise in HPI, the patient denies:  General: fever. Eyes: acute  vision changes ENT: sore throat Cardiovascular: chest pain Respiratory: SOB GI: vomiting GU: dysuria Musculoskeletal: acute back pain Derm: acute rash Neuro: acute motor dysfunction Psych: worsening mood Endocrine: polydipsia Heme: bleeding Allergy: hayfever  GEN: nad, alert and oriented HEENT: mucous membranes moist NECK: supple w/o LA CV: rrr. PULM: ctab, no inc wob ABD: soft, +bs EXT: no edema SKIN: no acute rash but flaking skin in the B ear canals.   No clear inguinal hernia that I can feel on exam.  No scrotal masses or lesions. He does have some tenderness just inferior to the midpoint of the R inguinal canal but I can't feel it enlarge with a cough.    Diabetic foot exam: Normal inspection No skin breakdown No calluses  Normal DP pulses Normal sensation to light touch and monofilament Nails normal

## 2017-12-31 ENCOUNTER — Telehealth: Payer: Self-pay | Admitting: Family Medicine

## 2017-12-31 DIAGNOSIS — Z7189 Other specified counseling: Secondary | ICD-10-CM | POA: Insufficient documentation

## 2017-12-31 NOTE — Telephone Encounter (Signed)
Call pt.  I looked back at old notes.  He does have some tenderness just inferior to the midpoint of the R inguinal canal but I can't feel it enlarge with a cough.  I think it makes sense to have him see general surgery if the pain persists.  Let me know if I need to put in a referral.    And have him update me about the lyrica vs gabapentin transition.  Thanks.

## 2017-12-31 NOTE — Assessment & Plan Note (Signed)
I want to consider options.  D/w pt.  See follow up phone note.

## 2017-12-31 NOTE — Assessment & Plan Note (Signed)
Living will d/w pt.  Wife designated if patient were incapacitated.   ?

## 2017-12-31 NOTE — Assessment & Plan Note (Signed)
Reasonable control, continue as is.  Continue work on diet and exercise and weight.  He agrees.

## 2017-12-31 NOTE — Assessment & Plan Note (Signed)
He tried taper of lyrica but the pain was worse on lower dose.  D/w pt.  We talked about trial of gabapentin trial again.   A1c up some compared to prev.   It may be that lyrica is driving his appetite up and that is affecting his weight and A1c.   Taking amitriptyline 20mg  at night with some relief.    Will try taper of lyrica and retry gabapentin.  Taper Lyrica down to 2 tabs for about 1 week.   Then try to take 1 lyrica a day and start gabapentin.   Try to stop the lyrica after taking 1 pill a day for about 1 week.  Start gabapentin 300mg  a day initially, then gradually work up to twice, then three times per day.   He'll update me as he goes along.   Recheck A1c in about 3 months.

## 2017-12-31 NOTE — Assessment & Plan Note (Signed)
OSA.  Needs recheck re: settings.  D/w pt.  Referred.

## 2017-12-31 NOTE — Assessment & Plan Note (Signed)
Tetanus 2018 PNA 2017 Shingles d/w pt.  Defer to 60.   Flu shot done at work.   D/w patient PI:Ivan Mcguire for colon cancer screening, including IFOB vs. colonoscopy.  Risks and benefits of both were discussed and patient voiced understanding.  Pt elects YSA:YTKZ.  Prostate cancer screening and PSA options (with potential risks and benefits of testing vs not testing) were discussed along with recent recs/guidelines.  He declined testing PSA at this point. Living will d/w pt.  Wife designated if patient were incapacitated.

## 2018-01-01 NOTE — Telephone Encounter (Signed)
Patient advised and says he will give [place] an update on the transition of the meds on MyChart and also about whether he is needing a surgical referral.

## 2018-01-04 ENCOUNTER — Encounter: Payer: Self-pay | Admitting: Internal Medicine

## 2018-01-04 ENCOUNTER — Ambulatory Visit: Payer: BLUE CROSS/BLUE SHIELD | Admitting: Internal Medicine

## 2018-01-04 VITALS — BP 142/90 | HR 82 | Ht 69.0 in | Wt 247.0 lb

## 2018-01-04 DIAGNOSIS — G4733 Obstructive sleep apnea (adult) (pediatric): Secondary | ICD-10-CM

## 2018-01-04 DIAGNOSIS — G4719 Other hypersomnia: Secondary | ICD-10-CM | POA: Diagnosis not present

## 2018-01-04 NOTE — Progress Notes (Signed)
Name: Misty Rago MRN: [id number] DOB: 09-Sep-1961     CONSULTATION DATE: 3.14.19 REFERRING MD : Damita Dunnings  CHIEF COMPLAINT: I have sleep apnea  STUDIES:  CXR independently reviewed - CT chest Independently reviewed-  HISTORY OF PRESENT ILLNESS: 57 yo obese pleasant white male seen today for assessment for OSA Patient has been dx with OSA since 1994 He has been on CPAP 8 cm h20 for last 20 years  Patient states that he is doing well with his current machine but its very old Patient has no acute issues at this time  No SOB, CP, DOE No signs of infection  No signs of CHF  Patient has DM last A1C at 6.3% 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 Prior to Admission medications   Medication Sig Start Date End Date Taking? Authorizing Provider  Alpha-Lipoic Acid (LIPOIC ACID PO) Take by mouth daily.   Yes [provider]  amitriptyline (ELAVIL) 10 MG tablet Take 1-3 tablets (10-30 mg total) by mouth at bedtime. For pain 02/27/17  Yes Tonia Ghent, MD  diphenhydrAMINE (BENADRYL) 25 mg capsule Take 25 mg by mouth at bedtime as needed.   Yes [provider]  gabapentin (NEURONTIN) 300 MG capsule Take 1 capsule (300 mg total) by mouth 3 (three) times daily as needed. Tapering off lyrica 12/28/17  Yes Tonia Ghent, MD  lisinopril (PRINIVIL,ZESTRIL) 20 MG tablet TAKE 1 TABLET DAILY 11/02/17  Yes Tonia Ghent, MD  metFORMIN (GLUCOPHAGE) 1000 MG tablet TAKE 1 TABLET TWICE A DAY WITH MEALS 11/20/17  Yes Tonia Ghent, MD  Multiple Vitamin (MULTIVITAMIN) tablet Take 1 tablet by mouth daily.   Yes [provider]  thiamine (VITAMIN B-1) 100 MG tablet Take 100  mg by mouth daily.   Yes [provider]  vitamin B-12 (CYANOCOBALAMIN) 1000 MCG tablet Take 1,000 mcg by mouth daily.   Yes [provider]   Allergies  Allergen Reactions  . Hydrocodone Itching    FAMILY HISTORY:  family history includes Angina in his mother; CAD in his father; Cancer in his maternal aunt; Dementia (age of onset: 5) in his father; Diabetes in his brother. SOCIAL HISTORY:  reports that  has never smoked. he has never used smokeless tobacco. He reports that he does not drink alcohol or use drugs.  REVIEW OF SYSTEMS:   Constitutional: Negative for fever, chills, weight loss, malaise/fatigue and diaphoresis.  HENT: Negative for hearing loss, ear pain, nosebleeds, congestion, sore throat, neck pain, tinnitus and ear discharge.   Eyes: Negative for blurred vision, double vision, photophobia, pain, discharge and redness.  Respiratory: Negative for cough, hemoptysis, sputum production, shortness of breath, wheezing and stridor.   Cardiovascular: Negative for chest pain, palpitations, orthopnea, claudication, leg swelling and PND.  Gastrointestinal: Negative for heartburn, nausea, vomiting, abdominal pain, diarrhea, constipation, blood in stool and melena.  Genitourinary: Negative for dysuria, urgency, frequency, hematuria and flank pain.  Musculoskeletal: Negative for myalgias, back pain, joint pain and falls.  Skin: Negative for itching and rash.  Neurological: Negative for dizziness, tingling, tremors, sensory change, speech change, focal weakness, seizures, loss of consciousness, weakness and headaches.  Endo/Heme/Allergies: Negative for environmental allergies and polydipsia. Does not bruise/bleed easily.  ALL OTHER  ROS ARE NEGATIVE   BP (!) 142/90 (BP Location: Left Arm, Cuff Size: Normal)   Pulse 82   Ht 5\' 9"  (1.753 m)   Wt 247 lb (112 kg)   SpO2 96%   BMI 36.48 kg/m   Physical Examination:   GENERAL:NAD, no fevers, chills, no weakness no  fatigue HEAD: Normocephalic, atraumatic.  EYES: Pupils equal, round, reactive to light. Extraocular muscles intact. No scleral icterus.  MOUTH: Moist mucosal membrane.   EAR, NOSE, THROAT: Clear without exudates. No external lesions.  NECK: Supple. No thyromegaly. No nodules. No JVD.  PULMONARY:CTA B/L no wheezes, no crackles, no rhonchi CARDIOVASCULAR: S1 and S2. Regular rate and rhythm. No murmurs, rubs, or gallops. No edema.  GASTROINTESTINAL: Soft, nontender, nondistended. No masses. Positive bowel sounds.  MUSCULOSKELETAL: No swelling, clubbing, or edema. Range of motion full in all extremities.  NEUROLOGIC: Cranial nerves II through XII are intact. No gross focal neurological deficits.  SKIN: No ulceration, lesions, rashes, or cyanosis. Skin warm and dry. Turgor intact.  PSYCHIATRIC: Mood, affect within normal limits. The patient is awake, alert and oriented x 3. Insight, judgment intact.      ASSESSMENT / PLAN: 57 yo obese white male with Dx of OSA will need re-asessment of OSA since it has ben over 20 years  1.sleep study 2.recommend weight loss  Follow up after test completed, may consider autoCPAP therapy  Patient satisfied with Plan of action and management. All questions answered  Corrin Parker, M.D.  [facility]  Medical Director [place] Director [facility] Cardio-Pulmonary Department

## 2018-01-04 NOTE — Patient Instructions (Signed)
Obtain Sleep study to re-assess Apnea

## 2018-01-27 ENCOUNTER — Encounter: Payer: Self-pay | Admitting: Internal Medicine

## 2018-01-27 DIAGNOSIS — G4733 Obstructive sleep apnea (adult) (pediatric): Secondary | ICD-10-CM | POA: Diagnosis not present

## 2018-01-27 DIAGNOSIS — G4719 Other hypersomnia: Secondary | ICD-10-CM

## 2018-01-31 ENCOUNTER — Other Ambulatory Visit: Payer: Self-pay | Admitting: Family Medicine

## 2018-01-31 NOTE — Telephone Encounter (Signed)
Electronic refill request Last refill 02/27/17 #270/3 Last office visit 12/28/17

## 2018-02-01 NOTE — Telephone Encounter (Signed)
Sent. Thanks.   

## 2018-02-02 DIAGNOSIS — G4733 Obstructive sleep apnea (adult) (pediatric): Secondary | ICD-10-CM | POA: Diagnosis not present

## 2018-02-05 ENCOUNTER — Telehealth: Payer: Self-pay | Admitting: *Deleted

## 2018-02-05 DIAGNOSIS — G4733 Obstructive sleep apnea (adult) (pediatric): Secondary | ICD-10-CM

## 2018-02-05 NOTE — Telephone Encounter (Signed)
Pt aware Orders placed Nothing further. 

## 2018-02-05 NOTE — Telephone Encounter (Signed)
Pt returning our call °Please call back ° °

## 2018-02-05 NOTE — Telephone Encounter (Signed)
LMTCB x1.ss 

## 2018-02-08 ENCOUNTER — Telehealth: Payer: Self-pay | Admitting: *Deleted

## 2018-02-08 NOTE — Telephone Encounter (Signed)
Patient called and states Suanne Marker was trying to call him pertaining to a cpap . Patient states you can call him back. Please advise. Thank you

## 2018-02-09 NOTE — Telephone Encounter (Signed)
Ivan Mcguire,  Pt was returning your call about the CPAP where you called.

## 2018-02-13 NOTE — Telephone Encounter (Signed)
Pt returned call and stated to send DME order to [facility] on [address].  Community message sent to [employer] with [facility] for CPAP set up at the [facility]. Nothing else needed at this time. [name]

## 2018-02-13 NOTE — Telephone Encounter (Signed)
Returned patient's call and didn't receive an answer. LMOAM for pt to contact me back on my number [phone] as to which DME he prefers to use. [name]

## 2018-02-15 NOTE — Telephone Encounter (Signed)
Patient aware of sleep study results. Orders submitted.

## 2018-02-16 ENCOUNTER — Telehealth: Payer: Self-pay | Admitting: *Deleted

## 2018-02-16 NOTE — Telephone Encounter (Signed)
Order already placed for CPAP. Pt informed of AHI. Nothing further needed.

## 2018-02-23 DIAGNOSIS — G4733 Obstructive sleep apnea (adult) (pediatric): Secondary | ICD-10-CM | POA: Diagnosis not present

## 2018-03-13 ENCOUNTER — Other Ambulatory Visit: Payer: Self-pay | Admitting: Family Medicine

## 2018-03-13 NOTE — Telephone Encounter (Signed)
Electronic refill request. Gabapentin Last office visit:   12/28/17 Last Filled:    100 capsule 3 12/28/2017  Please advise.

## 2018-03-14 NOTE — Telephone Encounter (Signed)
Sent. Thanks.   

## 2018-03-26 DIAGNOSIS — G4733 Obstructive sleep apnea (adult) (pediatric): Secondary | ICD-10-CM | POA: Diagnosis not present

## 2018-04-25 DIAGNOSIS — G4733 Obstructive sleep apnea (adult) (pediatric): Secondary | ICD-10-CM | POA: Diagnosis not present

## 2018-05-19 ENCOUNTER — Other Ambulatory Visit: Payer: Self-pay | Admitting: Family Medicine

## 2018-05-21 ENCOUNTER — Encounter: Payer: Self-pay | Admitting: Internal Medicine

## 2018-05-21 ENCOUNTER — Ambulatory Visit (INDEPENDENT_AMBULATORY_CARE_PROVIDER_SITE_OTHER): Payer: BLUE CROSS/BLUE SHIELD | Admitting: Internal Medicine

## 2018-05-21 VITALS — BP 148/80 | HR 78 | Resp 16 | Ht 69.0 in | Wt 250.0 lb

## 2018-05-21 DIAGNOSIS — G4733 Obstructive sleep apnea (adult) (pediatric): Secondary | ICD-10-CM | POA: Diagnosis not present

## 2018-05-21 NOTE — Progress Notes (Signed)
   Name: Ivan Mcguire MRN: [id number] DOB: Feb 16, 1961     CONSULTATION DATE: 3.14.19 REFERRING MD : Damita Dunnings  CHIEF COMPLAINT: follow up compliance  PREVIOUS HISTORY OF PRESENT ILLNESS: 57 yo obese pleasant white male seen today for assessment for OSA Patient has been dx with OSA since 1994 He has been on CPAP 8 cm h20 for last 20 years    Patient states that he is doing well with his current machine  Nasal pillows 100% compliance 30 days and >4 hrs AHI down to 0.4  Patient has no acute issues at this time  No SOB, CP, DOE No signs of infection  No signs of CHF  Patient has DM last A1C at 6.3% 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  HENT: Negative for hearing loss, ear pain, nosebleeds, congestion, sore throat, neck pain, tinnitus and ear discharge.   Eyes: Negative for blurred vision, double vision, photophobia, pain, discharge and redness.  Respiratory: Negative for cough, hemoptysis, sputum production, shortness of breath, wheezing and stridor.   Cardiovascular: Negative for chest pain, palpitations, orthopnea, claudication, leg swelling and PND.  ALL OTHER ROS ARE NEGATIVE   Resp 16   Ht 5\' 9"  (1.753 m)   Wt 250 lb (113.4 kg)   BMI 36.92 kg/m  148/80  Physical Examination:   GENERAL:NAD, no fevers, chills, no weakness no fatigue HEAD: Normocephalic, atraumatic.  EYES: Pupils equal, round, reactive to light. Extraocular muscles intact. No scleral icterus.  MOUTH: Moist mucosal membrane.   EAR, NOSE, THROAT: Clear without exudates. No external lesions.  NECK: Supple. No thyromegaly. No nodules. No JVD.  PULMONARY:CTA B/L no wheezes, no crackles, no rhonchi CARDIOVASCULAR: S1 and S2. Regular rate and rhythm. No murmurs, rubs, or gallops. No edema.    ASSESSMENT / PLAN: 57 yo obese white male with Dx of OSA   1.continue AutoCPAP as  prescibed 5-20 cm h20  2.recommend weight loss  Patient satisfied with Plan of action and management. All questions answered  Corrin Parker, M.D.  [facility]  Medical Director [place] Director [facility] Cardio-Pulmonary Department

## 2018-05-21 NOTE — Patient Instructions (Signed)
Continue AUTOCPAP as prescribed  

## 2018-05-26 DIAGNOSIS — G4733 Obstructive sleep apnea (adult) (pediatric): Secondary | ICD-10-CM | POA: Diagnosis not present

## 2018-06-26 DIAGNOSIS — G4733 Obstructive sleep apnea (adult) (pediatric): Secondary | ICD-10-CM | POA: Diagnosis not present

## 2018-07-26 DIAGNOSIS — G4733 Obstructive sleep apnea (adult) (pediatric): Secondary | ICD-10-CM | POA: Diagnosis not present

## 2018-07-30 ENCOUNTER — Other Ambulatory Visit: Payer: Self-pay | Admitting: Family Medicine

## 2018-07-30 DIAGNOSIS — I10 Essential (primary) hypertension: Secondary | ICD-10-CM

## 2018-07-30 NOTE — Telephone Encounter (Signed)
Electronic refill request Lisinopril Last office visit 01/07/18, was to have follow-up lab work in 3 months Last refill 11/02/17 #90/2

## 2018-07-31 ENCOUNTER — Encounter: Payer: Self-pay | Admitting: *Deleted

## 2018-07-31 ENCOUNTER — Other Ambulatory Visit: Payer: Self-pay | Admitting: Family Medicine

## 2018-07-31 DIAGNOSIS — I10 Essential (primary) hypertension: Secondary | ICD-10-CM

## 2018-07-31 NOTE — Telephone Encounter (Signed)
Sent. Needs OV.  Thanks.

## 2018-07-31 NOTE — Telephone Encounter (Signed)
Letter mailed

## 2018-08-26 DIAGNOSIS — G4733 Obstructive sleep apnea (adult) (pediatric): Secondary | ICD-10-CM | POA: Diagnosis not present

## 2018-09-23 ENCOUNTER — Other Ambulatory Visit: Payer: Self-pay | Admitting: Family Medicine

## 2018-09-23 DIAGNOSIS — E1149 Type 2 diabetes mellitus with other diabetic neurological complication: Secondary | ICD-10-CM

## 2018-09-25 DIAGNOSIS — G4733 Obstructive sleep apnea (adult) (pediatric): Secondary | ICD-10-CM | POA: Diagnosis not present

## 2018-09-27 ENCOUNTER — Other Ambulatory Visit (INDEPENDENT_AMBULATORY_CARE_PROVIDER_SITE_OTHER): Payer: BLUE CROSS/BLUE SHIELD

## 2018-09-27 DIAGNOSIS — E1149 Type 2 diabetes mellitus with other diabetic neurological complication: Secondary | ICD-10-CM | POA: Diagnosis not present

## 2018-09-28 LAB — COMPREHENSIVE METABOLIC PANEL
ALK PHOS: 91 U/L (ref 39–117)
ALT: 63 U/L — AB (ref 0–53)
AST: 70 U/L — AB (ref 0–37)
Albumin: 4.5 g/dL (ref 3.5–5.2)
BILIRUBIN TOTAL: 0.5 mg/dL (ref 0.2–1.2)
BUN: 16 mg/dL (ref 6–23)
CO2: 25 meq/L (ref 19–32)
Calcium: 9.5 mg/dL (ref 8.4–10.5)
Chloride: 100 mEq/L (ref 96–112)
Creatinine, Ser: 0.84 mg/dL (ref 0.40–1.50)
GFR: 99.42 mL/min (ref >60.00)
GLUCOSE: 142 mg/dL — AB (ref 70–99)
Potassium: 4.5 mEq/L (ref 3.5–5.1)
SODIUM: 135 meq/L (ref 135–145)
TOTAL PROTEIN: 8.1 g/dL (ref 6.0–8.3)

## 2018-09-28 LAB — HEMOGLOBIN A1C: Hgb A1c MFr Bld: 8.4 % — ABNORMAL HIGH (ref 4.6–6.5)

## 2018-09-28 LAB — LIPID PANEL
Cholesterol: 164 mg/dL (ref 0–200)
HDL: 39.3 mg/dL (ref >39.00)
NONHDL: 124.38
Total CHOL/HDL Ratio: 4
Triglycerides: 222 mg/dL — ABNORMAL HIGH (ref 0.0–149.0)
VLDL: 44.4 mg/dL — ABNORMAL HIGH (ref 0.0–40.0)

## 2018-09-28 LAB — LDL CHOLESTEROL, DIRECT: Direct LDL: 103 mg/dL

## 2018-10-02 ENCOUNTER — Ambulatory Visit: Payer: BLUE CROSS/BLUE SHIELD | Admitting: Family Medicine

## 2018-10-02 ENCOUNTER — Encounter: Payer: Self-pay | Admitting: Family Medicine

## 2018-10-02 VITALS — BP 136/80 | HR 96 | Temp 98.5°F | Ht 69.0 in | Wt 251.8 lb

## 2018-10-02 DIAGNOSIS — I10 Essential (primary) hypertension: Secondary | ICD-10-CM | POA: Diagnosis not present

## 2018-10-02 DIAGNOSIS — E1149 Type 2 diabetes mellitus with other diabetic neurological complication: Secondary | ICD-10-CM | POA: Diagnosis not present

## 2018-10-02 DIAGNOSIS — Z23 Encounter for immunization: Secondary | ICD-10-CM | POA: Diagnosis not present

## 2018-10-02 MED ORDER — METFORMIN HCL 1000 MG PO TABS: 1000.0000 mg | ORAL_TABLET | Freq: Two times a day (BID) | ORAL | 3 refills | Status: DC

## 2018-10-02 MED ORDER — LISINOPRIL 20 MG PO TABS: 20.0000 mg | ORAL_TABLET | Freq: Every day | ORAL | 3 refills | Status: DC

## 2018-10-02 MED ORDER — GABAPENTIN 300 MG PO CAPS: 300.0000 mg | ORAL_CAPSULE | Freq: Three times a day (TID) | ORAL | 3 refills | Status: DC | PRN

## 2018-10-02 NOTE — Patient Instructions (Addendum)
Flu shot today.  Call about an eye exam when possible.  Take care.  Glad to see you.  Recheck in about 3 months.   Labs ahead of time if possible.   Try gradually increasing gabapentin and then try an extra dose of amitriptyline on the weekends for pain.  Sedation caution.    If your sugar isn't getting better then let me know.   Check to see if the stool cards are still in date and if so then drop them off.

## 2018-10-02 NOTE — Progress Notes (Signed)
Diabetes:  Using medications without difficulties: yes Hypoglycemic episodes:no Hyperglycemic episodes:no Feet problems: foot pain, see below.   Blood Sugars averaging: 140-180s, up to 200s eye exam within last year: due, d/w pt.   A1c up, d/w pt.    Gabapentin helps some with pain.  He can tolerate that w/o ADE.  TCA makes patient a little tired but he usually only takes that at night.  He can tolerate that, d/w pt about cautions.  He may be able to tolerate a higher TCA dose when taken at night.  LFT elevation likely from fatty changes from DM/TG elevation.    He still has his IFOB from earlier in the year and he will see about completing that.  Discussed with patient.  See after visit summary.  Meds, vitals, and allergies reviewed.   ROS: Per HPI unless specifically indicated in ROS section   GEN: nad, alert and oriented HEENT: mucous membranes moist NECK: supple w/o LA CV: rrr. PULM: ctab, no inc wob ABD: soft, +bs EXT: no edema SKIN: no acute rash  Diabetic foot exam: Normal inspection No skin breakdown No calluses  Normal DP pulses Normal sensation to light touch but dec to monofilament Nails normal

## 2018-10-03 NOTE — Assessment & Plan Note (Signed)
If he can get his foot pain under control, he could likely better address his diet and exercise to help his sugar, d/w pt.  Increase gabapentin gradually.  Routine cautions given.  See after visit summary.  He can also try a higher dose of amitriptyline at night but he will try to increase his gabapentin first. He will work on diet.  No change in diabetic medicines otherwise at this point.  Recheck labs in 3 months.  If his sugar is not getting better in the meantime he will let me know.

## 2018-10-04 NOTE — Addendum Note (Signed)
Addended by: Josetta Huddle on: 10/04/2018 08:15 AM   Modules accepted: Orders

## 2018-10-11 ENCOUNTER — Encounter: Payer: Self-pay | Admitting: Family Medicine

## 2018-12-11 ENCOUNTER — Encounter: Payer: Self-pay | Admitting: Family Medicine

## 2018-12-11 ENCOUNTER — Other Ambulatory Visit: Payer: Self-pay | Admitting: *Deleted

## 2018-12-11 DIAGNOSIS — I10 Essential (primary) hypertension: Secondary | ICD-10-CM

## 2018-12-11 MED ORDER — LISINOPRIL 20 MG PO TABS: 20.0000 mg | ORAL_TABLET | Freq: Every day | ORAL | 0 refills | Status: DC

## 2018-12-11 MED ORDER — METFORMIN HCL 1000 MG PO TABS: 1000.0000 mg | ORAL_TABLET | Freq: Two times a day (BID) | ORAL | 0 refills | Status: DC

## 2018-12-11 NOTE — Telephone Encounter (Signed)
Patient requests a refill of Gabapentin (90 day supply) sent [facility] in [place].  He is due for a CPE in March and was made aware of that and patient says he will get that scheduled.  However, he will run out of Gabapentin in a few days Last office visit:   10/02/18 Acute Last Filled:    540 capsule 3 10/02/2018  Patient has refills remaining but he has to use a different pharmacy now that when these refills were issued.

## 2018-12-12 MED ORDER — GABAPENTIN 300 MG PO CAPS: 300.0000 mg | ORAL_CAPSULE | Freq: Three times a day (TID) | ORAL | 1 refills | Status: DC | PRN

## 2018-12-12 NOTE — Telephone Encounter (Signed)
Sent. Thanks.   

## 2018-12-22 ENCOUNTER — Other Ambulatory Visit: Payer: Self-pay | Admitting: Family Medicine

## 2019-01-04 ENCOUNTER — Encounter: Payer: Self-pay | Admitting: Family Medicine

## 2019-01-06 ENCOUNTER — Other Ambulatory Visit: Payer: Self-pay | Admitting: Family Medicine

## 2019-01-06 DIAGNOSIS — I10 Essential (primary) hypertension: Secondary | ICD-10-CM

## 2019-01-07 ENCOUNTER — Telehealth: Payer: Self-pay | Admitting: Family Medicine

## 2019-01-07 NOTE — Telephone Encounter (Signed)
Message  Received: Yesterday  Message Contents  Tonia Ghent, MD  Ramond Craver B        Please cancel his appointments for labs and a physical. He wanted to push it back with the recent coronavirus concerns. He will call back to reschedule. Thanks.

## 2019-01-09 ENCOUNTER — Other Ambulatory Visit: Payer: Self-pay | Admitting: Family Medicine

## 2019-01-09 DIAGNOSIS — I10 Essential (primary) hypertension: Secondary | ICD-10-CM

## 2019-01-09 MED ORDER — LISINOPRIL 20 MG PO TABS: 20.0000 mg | ORAL_TABLET | Freq: Every day | ORAL | 0 refills | Status: DC

## 2019-01-11 ENCOUNTER — Other Ambulatory Visit: Payer: BLUE CROSS/BLUE SHIELD

## 2019-01-17 ENCOUNTER — Encounter: Payer: BLUE CROSS/BLUE SHIELD | Admitting: Family Medicine

## 2019-02-06 ENCOUNTER — Telehealth: Payer: Self-pay | Admitting: Family Medicine

## 2019-02-06 NOTE — Telephone Encounter (Signed)
LVM for pt to call us back and schedule curbside labs and virtual/phone visit for Dr. Damita Dunnings.

## 2019-02-15 ENCOUNTER — Other Ambulatory Visit: Payer: Self-pay

## 2019-02-15 ENCOUNTER — Other Ambulatory Visit (INDEPENDENT_AMBULATORY_CARE_PROVIDER_SITE_OTHER): Payer: BLUE CROSS/BLUE SHIELD

## 2019-02-15 DIAGNOSIS — E1149 Type 2 diabetes mellitus with other diabetic neurological complication: Secondary | ICD-10-CM

## 2019-02-15 LAB — COMPREHENSIVE METABOLIC PANEL
ALT: 38 U/L (ref 0–53)
AST: 33 U/L (ref 0–37)
Albumin: 4.2 g/dL (ref 3.5–5.2)
Alkaline Phosphatase: 98 U/L (ref 39–117)
BUN: 20 mg/dL (ref 6–23)
CO2: 27 mEq/L (ref 19–32)
Calcium: 9 mg/dL (ref 8.4–10.5)
Chloride: 102 mEq/L (ref 96–112)
Creatinine, Ser: 0.8 mg/dL (ref 0.40–1.50)
GFR: 98.83 mL/min (ref >60.00)
Glucose, Bld: 218 mg/dL — ABNORMAL HIGH (ref 70–99)
Potassium: 4.7 mEq/L (ref 3.5–5.1)
Sodium: 137 mEq/L (ref 135–145)
Total Bilirubin: 0.4 mg/dL (ref 0.2–1.2)
Total Protein: 7.4 g/dL (ref 6.0–8.3)

## 2019-02-15 LAB — LIPID PANEL
Cholesterol: 153 mg/dL (ref 0–200)
HDL: 36.7 mg/dL — ABNORMAL LOW (ref >39.00)
LDL Cholesterol: 80 mg/dL (ref 0–99)
NonHDL: 116.27
Total CHOL/HDL Ratio: 4
Triglycerides: 179 mg/dL — ABNORMAL HIGH (ref 0.0–149.0)
VLDL: 35.8 mg/dL (ref 0.0–40.0)

## 2019-02-15 LAB — HEMOGLOBIN A1C: Hgb A1c MFr Bld: 8.1 % — ABNORMAL HIGH (ref 4.6–6.5)

## 2019-02-19 ENCOUNTER — Encounter: Payer: Self-pay | Admitting: Family Medicine

## 2019-02-19 ENCOUNTER — Ambulatory Visit (INDEPENDENT_AMBULATORY_CARE_PROVIDER_SITE_OTHER): Payer: BLUE CROSS/BLUE SHIELD | Admitting: Family Medicine

## 2019-02-19 ENCOUNTER — Telehealth: Payer: Self-pay | Admitting: Family Medicine

## 2019-02-19 VITALS — Ht 69.0 in

## 2019-02-19 DIAGNOSIS — E785 Hyperlipidemia, unspecified: Secondary | ICD-10-CM | POA: Diagnosis not present

## 2019-02-19 DIAGNOSIS — I10 Essential (primary) hypertension: Secondary | ICD-10-CM | POA: Diagnosis not present

## 2019-02-19 DIAGNOSIS — E1149 Type 2 diabetes mellitus with other diabetic neurological complication: Secondary | ICD-10-CM | POA: Diagnosis not present

## 2019-02-19 MED ORDER — GABAPENTIN 300 MG PO CAPS: 600.0000 mg | ORAL_CAPSULE | Freq: Three times a day (TID) | ORAL | 1 refills | Status: DC | PRN

## 2019-02-19 MED ORDER — LISINOPRIL 20 MG PO TABS: 20.0000 mg | ORAL_TABLET | Freq: Every day | ORAL | 3 refills | Status: DC

## 2019-02-19 MED ORDER — METFORMIN HCL 1000 MG PO TABS: 1000.0000 mg | ORAL_TABLET | Freq: Two times a day (BID) | ORAL | 3 refills | Status: DC

## 2019-02-19 MED ORDER — PRAVASTATIN SODIUM 10 MG PO TABS: 10.0000 mg | ORAL_TABLET | Freq: Every day | ORAL | 3 refills | Status: DC

## 2019-02-19 MED ORDER — AMITRIPTYLINE HCL 10 MG PO TABS: 10.0000 mg | ORAL_TABLET | Freq: Every day | ORAL | Status: DC

## 2019-02-19 NOTE — Telephone Encounter (Signed)
Called pt and left voicemail message for pt to call back and schedule appointment.   Needs AM fasting lab visit in about 3 months with follow up a few days after that re: DM2 per Dr. Damita Dunnings

## 2019-02-19 NOTE — Progress Notes (Signed)
Virtual visit completed through WebEx or similar program Patient location: home  Provider location: [place] at [facility], office   Limitations and rationale for visit method d/w patient.  Patient agreed to proceed.   CC: DM  HPI: Diabetes:  Using medications without difficulties: yes Hypoglycemic episodes:no Hyperglycemic episodes:no Feet problems: taking gabapentin 600-900mg  3 times a day for foot pain.  Taking amitriptyline 10mg  at night, higher dose caused him to be drowsy.   Blood Sugars averaging: 140-160 usually.  eye exam within last year: d/w pt.   He has been working hard on diet and exercise.   Defer med change at this point given A1c decrease.    HLD.  D/w pt about statin use/trial.  Rationale d/w pt.  If he has aches, then stop med and update me.  Continue work on diet and exercise in about 3 months.    LFTs normalized.  D/w pt.    Meds and allergies reviewed.   ROS: Per HPI unless specifically indicated in ROS section   NAD Speech wnl  A/P:  DM2 He has been working hard on diet and exercise.   Defer med change at this point given A1c decrease.   He can continue amitriptyline and gabapentin for foot pain.  Update me as needed.  Recheck periodically.  He agrees.  HLD.  D/w pt about statin use/trial.  Rationale d/w pt.  If he has aches, then stop med and update me.  Continue work on diet and exercise in about 3 months.    LFTs normalized.  D/w pt.

## 2019-02-20 DIAGNOSIS — E785 Hyperlipidemia, unspecified: Secondary | ICD-10-CM | POA: Insufficient documentation

## 2019-02-20 NOTE — Assessment & Plan Note (Signed)
He has been working hard on diet and exercise.   Defer med change at this point given A1c decrease.   He can continue amitriptyline and gabapentin for foot pain.  Update me as needed.  Recheck periodically.  He agrees.

## 2019-02-20 NOTE — Assessment & Plan Note (Signed)
  HLD.  D/w pt about statin use/trial.  Rationale d/w pt.  If he has aches, then stop med and update me.  Continue work on diet and exercise in about 3 months.    LFTs normalized.  D/w pt.

## 2019-07-09 ENCOUNTER — Other Ambulatory Visit: Payer: Self-pay

## 2019-07-09 DIAGNOSIS — Z20822 Contact with and (suspected) exposure to covid-19: Secondary | ICD-10-CM

## 2019-07-09 DIAGNOSIS — R6889 Other general symptoms and signs: Secondary | ICD-10-CM | POA: Diagnosis not present

## 2019-07-11 LAB — NOVEL CORONAVIRUS, NAA: SARS-CoV-2, NAA: NOT DETECTED

## 2019-08-26 ENCOUNTER — Other Ambulatory Visit: Payer: Self-pay | Admitting: Family Medicine

## 2019-08-26 NOTE — Telephone Encounter (Signed)
Sent. Thanks.  Needs Dm2 f/u scheduled.

## 2019-08-26 NOTE — Telephone Encounter (Signed)
Electronic refill request. Gabapentin Last office visit:   02/19/2019 Last Filled:    630 capsule 1 02/19/2019  Please advise.

## 2019-08-27 NOTE — Telephone Encounter (Signed)
Letter mailed

## 2019-10-03 ENCOUNTER — Other Ambulatory Visit: Payer: Self-pay

## 2019-10-03 ENCOUNTER — Encounter: Payer: Self-pay | Admitting: Family Medicine

## 2019-10-03 ENCOUNTER — Ambulatory Visit (INDEPENDENT_AMBULATORY_CARE_PROVIDER_SITE_OTHER): Payer: BLUE CROSS/BLUE SHIELD | Admitting: Family Medicine

## 2019-10-03 VITALS — BP 128/74 | HR 89 | Temp 97.7°F | Ht 69.0 in | Wt 245.4 lb

## 2019-10-03 DIAGNOSIS — E1149 Type 2 diabetes mellitus with other diabetic neurological complication: Secondary | ICD-10-CM

## 2019-10-03 DIAGNOSIS — E119 Type 2 diabetes mellitus without complications: Secondary | ICD-10-CM

## 2019-10-03 DIAGNOSIS — G4700 Insomnia, unspecified: Secondary | ICD-10-CM | POA: Diagnosis not present

## 2019-10-03 DIAGNOSIS — N200 Calculus of kidney: Secondary | ICD-10-CM

## 2019-10-03 LAB — POCT GLYCOSYLATED HEMOGLOBIN (HGB A1C): Hemoglobin A1C: 9.9 % — AB (ref 4.0–5.6)

## 2019-10-03 NOTE — Progress Notes (Signed)
This visit occurred during the SARS-CoV-2 public health emergency.  Safety protocols were in place, including screening questions prior to the visit, additional usage of staff PPE, and extensive cleaning of exam room while observing appropriate contact time as indicated for disinfecting solutions.  Diabetes:  Using medications without difficulties: yes Hypoglycemic episodes: no Hyperglycemic episodes: no Feet problems: still with neuropathy sx, some dec in sensation.    Blood Sugars averaging: up to 200s.  eye exam within last year:due, d/w pt.   We talked about diet change vs change in meds.    He passed 3 kidney stones earlier in the year.  He still has them.  I asked him to bring them in for analysis.  See orders.  D/w pt.   Flu shot done at work recently.  Shingles shot d/w pt.   Taking 20mg  elavil at night.  That is helping with neuropathy sx/RLS.  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  He may need intermittent leave.    Meds, vitals, and allergies reviewed.  ROS: Per HPI unless specifically indicated in ROS section   GEN: nad, alert and oriented HEENT: ncat NECK: supple w/o LA CV: rrr. PULM: ctab, no inc wob ABD: soft, +bs EXT: no edema SKIN: no acute rash  Diabetic foot exam: Normal inspection No skin breakdown No calluses  Normal DP pulses Intact but altered sensation on the bilateral lower extremities to light touch and monofilament. Nails normal

## 2019-10-03 NOTE — Patient Instructions (Addendum)
Please send me the FMLA papers and I'll work on them.  Take care.  Glad to see you.   Recheck in about 3 months.  A1c at the visit.  Bring the in kidney stones for analysis.  If your sugar isn't better with diet changes in the meantime then let me know.

## 2019-10-06 DIAGNOSIS — N200 Calculus of kidney: Secondary | ICD-10-CM | POA: Insufficient documentation

## 2019-10-06 DIAGNOSIS — G4700 Insomnia, unspecified: Secondary | ICD-10-CM | POA: Insufficient documentation

## 2019-10-06 NOTE — Assessment & Plan Note (Signed)
He passed 3 kidney stones earlier in the year.  He still has them.  I asked him to bring them in for analysis.  See orders.  D/w pt.

## 2019-10-06 NOTE — Assessment & Plan Note (Signed)
We talked about options.  He expected his sugars to be up more because he has not been strict with his diet.  He wanted to work more on his diet and then if not improved he will update me and we can change his medications.  Plan on recheck periodically but he will update me as needed in the meantime, if not seeing some progress with his sugars.

## 2019-10-06 NOTE — Assessment & Plan Note (Signed)
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 Taking 20mg  elavil at night.  That is helping with neuropathy sx/RLS.  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  He may need intermittent leave.   I can fill out the papers for the patient. Better control of his sugar as described above could help with neuropathy.  Discussed.  The patient thinks he can make significant changes and I support his effort.

## 2019-10-28 ENCOUNTER — Other Ambulatory Visit: Payer: Self-pay | Admitting: Family Medicine

## 2019-10-28 NOTE — Telephone Encounter (Signed)
Electronic refill request. Gabapentin Last office visit:   10/03/2019 Last Filled:    630 capsule 1 08/26/2019   Please advise.

## 2019-10-29 NOTE — Telephone Encounter (Signed)
Sent. Thanks.   

## 2019-11-27 ENCOUNTER — Telehealth: Payer: Self-pay | Admitting: Family Medicine

## 2019-11-27 NOTE — Telephone Encounter (Signed)
I will work on the hardcopy when I get back to office.  Thanks.

## 2019-11-27 NOTE — Telephone Encounter (Signed)
FMLA paperwork in DrDuncan in box  Pt stated he wanted intermittent 2 x month lasting 2 days for insomnia /neuropathy He stated he is not out of work that much for this

## 2019-12-04 NOTE — Telephone Encounter (Signed)
I still have the paperwork and I will have it ready to go tomorrow.  Thanks.  I apologize for the delay.  I am trying to work on paperwork as quickly as I can.

## 2019-12-04 NOTE — Telephone Encounter (Signed)
Do you still have this paperwork?

## 2019-12-04 NOTE — Telephone Encounter (Signed)
No hurry,  I was just making sure I didn't lose it

## 2019-12-05 ENCOUNTER — Encounter: Payer: Self-pay | Admitting: Family Medicine

## 2019-12-05 NOTE — Telephone Encounter (Signed)
Paperwork faxed °

## 2019-12-06 NOTE — Telephone Encounter (Signed)
Copy for pt °Copy for scan °

## 2019-12-06 NOTE — Telephone Encounter (Signed)
Sent pt my chart message letting himknow paperwork has been faxed

## 2020-01-11 ENCOUNTER — Ambulatory Visit: Payer: BLUE CROSS/BLUE SHIELD | Attending: Internal Medicine

## 2020-01-11 DIAGNOSIS — Z23 Encounter for immunization: Secondary | ICD-10-CM

## 2020-01-11 NOTE — Progress Notes (Signed)
   Covid-19 Vaccination Clinic  Name:  Ivan Mcguire    MRN: [id number] DOB: [birth year]  01/11/2020  [name] was observed post Covid-19 immunization for 15 minutes without incident. He was provided with Vaccine Information Sheet and instruction to access the V-Safe system.   [name] was instructed to call 911 with any severe reactions post vaccine: Marland Kitchen Difficulty breathing  . Swelling of face and throat  . A fast heartbeat  . A bad rash all over body  . Dizziness and weakness   Immunizations Administered    Name Date Dose VIS Date Route   Moderna COVID-19 Vaccine 01/11/2020  9:03 AM 0.5 mL 09/24/2019 Intramuscular   Manufacturer: Moderna   Lot: [id number]   [id number]

## 2020-01-17 ENCOUNTER — Ambulatory Visit: Payer: BLUE CROSS/BLUE SHIELD | Admitting: Family Medicine

## 2020-02-12 ENCOUNTER — Ambulatory Visit: Payer: BLUE CROSS/BLUE SHIELD

## 2020-02-12 ENCOUNTER — Ambulatory Visit: Payer: BLUE CROSS/BLUE SHIELD | Attending: Internal Medicine

## 2020-02-12 DIAGNOSIS — Z23 Encounter for immunization: Secondary | ICD-10-CM

## 2020-02-12 NOTE — Progress Notes (Signed)
   Covid-19 Vaccination Clinic  Name:  Ivan Mcguire    MRN: [id number] DOB: 1961-08-02  02/12/2020  [name] was observed post Covid-19 immunization for 15 minutes without incident. He was provided with Vaccine Information Sheet and instruction to access the V-Safe system.   [name] was instructed to call 911 with any severe reactions post vaccine: Marland Kitchen Difficulty breathing  . Swelling of face and throat  . A fast heartbeat  . A bad rash all over body  . Dizziness and weakness   Immunizations Administered    Name Date Dose VIS Date Route   Moderna COVID-19 Vaccine 02/12/2020  8:42 AM 0.5 mL 09/2019 Intramuscular   Manufacturer: Moderna   Lot: [id number]   [place]:[id number]

## 2020-02-26 ENCOUNTER — Other Ambulatory Visit: Payer: Self-pay | Admitting: Family Medicine

## 2020-03-12 ENCOUNTER — Ambulatory Visit: Payer: BLUE CROSS/BLUE SHIELD | Admitting: Family Medicine

## 2020-03-25 ENCOUNTER — Other Ambulatory Visit: Payer: Self-pay | Admitting: Family Medicine

## 2020-03-26 NOTE — Telephone Encounter (Signed)
Pt is calling regarding his Metformin Rx  Please advise, thanks.

## 2020-03-26 NOTE — Telephone Encounter (Signed)
Left message for Ivan Mcguire that a refill for his Metformin has been sent his pharmacy.

## 2020-04-02 ENCOUNTER — Other Ambulatory Visit: Payer: Self-pay

## 2020-04-02 ENCOUNTER — Ambulatory Visit (INDEPENDENT_AMBULATORY_CARE_PROVIDER_SITE_OTHER): Payer: BLUE CROSS/BLUE SHIELD | Admitting: Family Medicine

## 2020-04-02 ENCOUNTER — Encounter: Payer: Self-pay | Admitting: Family Medicine

## 2020-04-02 ENCOUNTER — Other Ambulatory Visit: Payer: Self-pay | Admitting: Family Medicine

## 2020-04-02 VITALS — BP 126/70 | HR 71 | Temp 96.7°F | Ht 69.0 in | Wt 235.4 lb

## 2020-04-02 DIAGNOSIS — N200 Calculus of kidney: Secondary | ICD-10-CM | POA: Diagnosis not present

## 2020-04-02 DIAGNOSIS — E119 Type 2 diabetes mellitus without complications: Secondary | ICD-10-CM

## 2020-04-02 DIAGNOSIS — E1149 Type 2 diabetes mellitus with other diabetic neurological complication: Secondary | ICD-10-CM | POA: Diagnosis not present

## 2020-04-02 DIAGNOSIS — I10 Essential (primary) hypertension: Secondary | ICD-10-CM

## 2020-04-02 LAB — POCT GLYCOSYLATED HEMOGLOBIN (HGB A1C): Hemoglobin A1C: 7.1 % — AB (ref 4.0–5.6)

## 2020-04-02 MED ORDER — AMITRIPTYLINE HCL 10 MG PO TABS: 10.0000 mg | ORAL_TABLET | Freq: Every day | ORAL | 3 refills | Status: DC

## 2020-04-02 NOTE — Patient Instructions (Addendum)
Try the higher dose of amitriptyline at night for pain.  Hold the pravastatin for about 1-2 weeks and see if the aches get better.  Either way, let me know.    We'll update you about the stone analysis.    Take care.  Glad to see you. Please schedule a physical for the fall with labs ahead of time.

## 2020-04-02 NOTE — Progress Notes (Signed)
This visit occurred during the SARS-CoV-2 public health emergency.  Safety protocols were in place, including screening questions prior to the visit, additional usage of staff PPE, and extensive cleaning of exam room while observing appropriate contact time as indicated for disinfecting solutions.  Diabetes:  Using medications without difficulties: yes Hypoglycemic episodes:no Hyperglycemic episodes: no Feet problems: at baseline, still on gabapentin at baseline and we talked about inc dose of amitriptyline at night with routine cautions.  Blood Sugars averaging: 140s, below 200.   eye exam within last year: due, d/w pt.   A1c done at [facility].  He is working on diet and exercise.    He has some diffuse aches.  We talked about holding pravastatin for about 1-2 weeks and seeing if that helped.   He had covid vaccine.  D/w pt.    He brought in renal stones for analysis.    Meds, vitals, and allergies reviewed.   ROS: Per HPI unless specifically indicated in ROS section   GEN: nad, alert and oriented HEENT: ncat NECK: supple w/o LA CV: rrr. PULM: ctab, no inc wob ABD: soft, +bs EXT: no edema SKIN: no acute rash

## 2020-04-05 NOTE — Assessment & Plan Note (Signed)
Stone analysis pending

## 2020-04-05 NOTE — Assessment & Plan Note (Signed)
Discussed options.  A1c clearly better, discussed with patient.  I thanked him for his effort  Reasonable to try a higher dose of amitriptyline at night for pain.  Given his aches, I want him to hold the pravastatin for about 1-2 weeks and see if the aches get better.  Either way, he will let me know.    We can consider Ozempic if needed in the future but given his improvement in his A1c I would not add that on yet, especially with the medication changes described above.  He agreed.

## 2020-04-06 LAB — STONE ANALYSIS: Stone Weight: 0.102 g

## 2020-05-06 ENCOUNTER — Telehealth: Payer: Self-pay | Admitting: Family Medicine

## 2020-05-06 NOTE — Telephone Encounter (Signed)
I will work on the hardcopy when I get back to clinic.  Thanks.

## 2020-05-06 NOTE — Telephone Encounter (Signed)
Pt called wanting to get his FMLA paperwork updated.  He stated all we needed to do was change dates initial and date changes  Paperwork in dr Damita Dunnings in box

## 2020-05-28 ENCOUNTER — Other Ambulatory Visit: Payer: Self-pay | Admitting: Family Medicine

## 2020-06-17 ENCOUNTER — Other Ambulatory Visit: Payer: Self-pay | Admitting: Family Medicine

## 2020-09-07 ENCOUNTER — Other Ambulatory Visit: Payer: Self-pay

## 2020-09-07 ENCOUNTER — Encounter: Payer: Self-pay | Admitting: Family Medicine

## 2020-09-07 ENCOUNTER — Ambulatory Visit (INDEPENDENT_AMBULATORY_CARE_PROVIDER_SITE_OTHER): Payer: BLUE CROSS/BLUE SHIELD | Admitting: Family Medicine

## 2020-09-07 ENCOUNTER — Other Ambulatory Visit: Payer: Self-pay | Admitting: Family Medicine

## 2020-09-07 VITALS — BP 126/80 | HR 81 | Temp 97.8°F | Ht 69.0 in | Wt 245.3 lb

## 2020-09-07 DIAGNOSIS — Z1211 Encounter for screening for malignant neoplasm of colon: Secondary | ICD-10-CM

## 2020-09-07 DIAGNOSIS — R202 Paresthesia of skin: Secondary | ICD-10-CM

## 2020-09-07 DIAGNOSIS — Z23 Encounter for immunization: Secondary | ICD-10-CM | POA: Diagnosis not present

## 2020-09-07 DIAGNOSIS — E1149 Type 2 diabetes mellitus with other diabetic neurological complication: Secondary | ICD-10-CM | POA: Diagnosis not present

## 2020-09-07 DIAGNOSIS — G720 Drug-induced myopathy: Secondary | ICD-10-CM

## 2020-09-07 LAB — COMPREHENSIVE METABOLIC PANEL
ALT: 29 U/L (ref 0–53)
AST: 24 U/L (ref 0–37)
Albumin: 4.2 g/dL (ref 3.5–5.2)
Alkaline Phosphatase: 85 U/L (ref 39–117)
BUN: 24 mg/dL — ABNORMAL HIGH (ref 6–23)
CO2: 28 mEq/L (ref 19–32)
Calcium: 9.2 mg/dL (ref 8.4–10.5)
Chloride: 104 mEq/L (ref 96–112)
Creatinine, Ser: 1.05 mg/dL (ref 0.40–1.50)
GFR: 76.95 mL/min (ref >60.00)
Glucose, Bld: 144 mg/dL — ABNORMAL HIGH (ref 70–99)
Potassium: 5.2 mEq/L — ABNORMAL HIGH (ref 3.5–5.1)
Sodium: 139 mEq/L (ref 135–145)
Total Bilirubin: 0.4 mg/dL (ref 0.2–1.2)
Total Protein: 7.3 g/dL (ref 6.0–8.3)

## 2020-09-07 LAB — TSH: TSH: 1.46 u[IU]/mL (ref 0.35–4.50)

## 2020-09-07 LAB — HEMOGLOBIN A1C: Hgb A1c MFr Bld: 7.6 % — ABNORMAL HIGH (ref 4.6–6.5)

## 2020-09-07 LAB — LIPID PANEL
Cholesterol: 164 mg/dL (ref 0–200)
HDL: 39.1 mg/dL (ref >39.00)
NonHDL: 125.01
Total CHOL/HDL Ratio: 4
Triglycerides: 204 mg/dL — ABNORMAL HIGH (ref 0.0–149.0)
VLDL: 40.8 mg/dL — ABNORMAL HIGH (ref 0.0–40.0)

## 2020-09-07 LAB — LDL CHOLESTEROL, DIRECT: Direct LDL: 96 mg/dL

## 2020-09-07 LAB — VITAMIN B12: Vitamin B-12: 1126 pg/mL — ABNORMAL HIGH (ref 211–911)

## 2020-09-07 NOTE — Progress Notes (Signed)
This visit occurred during the SARS-CoV-2 public health emergency.  Safety protocols were in place, including screening questions prior to the visit, additional usage of staff PPE, and extensive cleaning of exam room while observing appropriate contact time as indicated for disinfecting solutions.  Diabetes:  Using medications without difficulties: yes Hypoglycemic episodes:no Hyperglycemic episodes:no Feet problems: gabapentin 600mg  TID helps.  No ADE on med.  Less shooting pain in the feet with med use.  It isn't getting better.   Blood Sugars averaging: 130-180.   eye exam within last year: d/w pt to be done when possible.   Labs pending.  He is fasting.   He was asking about ozempic use.  I would like to see his labs first.  See notes on labs.  covid vaccine 2021, d/w pt.  Flu shot today.    He had more aches on pravastatin, improved off med.    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  He is trying to manage the situation.  He occ takes a sick day to rest due to insomnia.    D/w patient [id number] for colon cancer screening, including IFOB vs. colonoscopy.  Risks and benefits of both were discussed and patient voiced understanding.  Pt elects TZG:YFVC  PMH and SH reviewed Meds, vitals, and allergies reviewed.   ROS: Per HPI unless specifically indicated in ROS section   GEN: nad, alert and oriented HEENT: ncat NECK: supple w/o LA CV: rrr. PULM: ctab, no inc wob ABD: soft, +bs EXT: no edema SKIN: no acute rash  Diabetic foot exam: Normal inspection No skin breakdown No calluses  Normal DP pulses Normal sensation to light touch and monofilament Nails normal

## 2020-09-07 NOTE — Telephone Encounter (Signed)
Pharmacy requests refill on: Metformin HCL 1,000 mg  LAST REFILL: 06/17/2020 LAST OV: 09/07/2020 NEXT OV: Not Scheduled PHARMACY: [id number] [place], [place]   Last HgbA1C(09/07/2020): 7.6

## 2020-09-07 NOTE — Patient Instructions (Addendum)
Flu shot today.  Covid booster in about 1 month.  Check with your insurance to see if they will cover the shingles shot. Take care.  Glad to see you. Go to the lab on the way out.   If you have mychart we'll likely use that to update you.    We'll see about med options when I get your results.

## 2020-09-09 ENCOUNTER — Other Ambulatory Visit: Payer: Self-pay | Admitting: Family Medicine

## 2020-09-09 DIAGNOSIS — G720 Drug-induced myopathy: Secondary | ICD-10-CM | POA: Insufficient documentation

## 2020-09-09 DIAGNOSIS — Z1211 Encounter for screening for malignant neoplasm of colon: Secondary | ICD-10-CM | POA: Insufficient documentation

## 2020-09-09 DIAGNOSIS — R202 Paresthesia of skin: Secondary | ICD-10-CM | POA: Insufficient documentation

## 2020-09-09 MED ORDER — GABAPENTIN 300 MG PO CAPS
ORAL_CAPSULE | ORAL | 1 refills | Status: DC
Start: 2020-09-09 — End: 2020-10-21

## 2020-09-09 MED ORDER — OZEMPIC (0.25 OR 0.5 MG/DOSE) 2 MG/1.5ML ~~LOC~~ SOPN: 0.2500 mg | PEN_INJECTOR | SUBCUTANEOUS | 1 refills | Status: DC

## 2020-09-09 NOTE — Assessment & Plan Note (Signed)
D/w patient re:options for colon cancer screening, including IFOB vs. colonoscopy.  Risks and benefits of both were discussed and patient voiced understanding.  Pt elects for:IFOB  

## 2020-09-09 NOTE — Assessment & Plan Note (Signed)
Blood sugars have been 130-180.  Discussed with patient about eye exam.  See notes on follow-up labs.  He was asking about possible Ozempic use.  I want to see his labs first.  Flu shot done.  Statin intolerant.

## 2020-09-09 NOTE — Assessment & Plan Note (Signed)
He had more aches on pravastatin, improved off med.

## 2020-09-09 NOTE — Assessment & Plan Note (Signed)
His foot exam is normal and that he has intact sensation and dorsalis pedis pulses but he does report foot symptoms with pain in the feet that is improved with gabapentin.  Discussed differential diagnosis of paresthesia and reasonable to recheck routine labs today.  See notes on labs.  Continue gabapentin for now.

## 2020-09-10 ENCOUNTER — Telehealth: Payer: Self-pay | Admitting: Family Medicine

## 2020-09-10 NOTE — Telephone Encounter (Signed)
Pt called stating dr Damita Dunnings wanted pt to have an appointment with you

## 2020-09-14 ENCOUNTER — Encounter: Payer: Self-pay | Admitting: Family Medicine

## 2020-09-16 ENCOUNTER — Encounter: Payer: Self-pay | Admitting: Family Medicine

## 2020-09-16 ENCOUNTER — Telehealth: Payer: Self-pay | Admitting: Family Medicine

## 2020-09-16 NOTE — Telephone Encounter (Signed)
Thanks

## 2020-09-16 NOTE — Telephone Encounter (Signed)
If possible, please update his FMLA form with the request in the meantime and I will sign it when I get back to clinic next week.  Thanks.

## 2020-09-16 NOTE — Telephone Encounter (Signed)
Updated and placed back in inbox for your return.

## 2020-09-16 NOTE — Telephone Encounter (Signed)
FMLA paperwork came in through fax from patient's employer wanting approval for 2 extra days.Placed in Dr.Duncan's box for review and signature.

## 2020-09-24 NOTE — Telephone Encounter (Signed)
Called patient and lvm to return call.  When patient calls please notify FMLA paperwork is done, was faxed, and copy of it is ready for pick up.   Copy for scan Copy for patient

## 2020-09-28 ENCOUNTER — Ambulatory Visit: Payer: Self-pay

## 2020-09-28 NOTE — Chronic Care Management (AMB) (Signed)
Attempted to reach patient today to discuss Ozempic administration and overview new medication. Patient did not answer. Left voicemail with contact information. Pt has not been scheduled for CCM as his insurance will not cover the service. However, I am still happy to assist with any medication needs.   Debbora Dus, PharmD Clinical Pharmacist [facility] [phone]

## 2020-10-05 ENCOUNTER — Encounter: Payer: Self-pay | Admitting: Family Medicine

## 2020-10-07 ENCOUNTER — Other Ambulatory Visit: Payer: Self-pay | Admitting: Family Medicine

## 2020-10-07 MED ORDER — OZEMPIC (0.25 OR 0.5 MG/DOSE) 2 MG/1.5ML ~~LOC~~ SOPN
0.5000 mg | PEN_INJECTOR | SUBCUTANEOUS | 1 refills | Status: DC
Start: 2020-10-07 — End: 2021-04-13

## 2020-10-15 ENCOUNTER — Encounter: Payer: Self-pay | Admitting: Family Medicine

## 2020-10-20 ENCOUNTER — Other Ambulatory Visit: Payer: Self-pay | Admitting: Family Medicine

## 2020-10-21 NOTE — Telephone Encounter (Signed)
Please Advise

## 2020-10-21 NOTE — Telephone Encounter (Signed)
Sent. Thanks.   

## 2020-11-06 NOTE — Telephone Encounter (Signed)
Patient faxed paperwork. Given to PCP to fill and sign.

## 2020-11-09 NOTE — Telephone Encounter (Signed)
Form done.  Please scan and send in, notify patient.  Thanks.

## 2020-11-16 NOTE — Telephone Encounter (Signed)
Faxed paperwork again.  Copy for scan Copy for patient

## 2020-11-27 ENCOUNTER — Other Ambulatory Visit: Payer: Self-pay | Admitting: Family Medicine

## 2020-12-03 ENCOUNTER — Other Ambulatory Visit: Payer: Self-pay | Admitting: Family Medicine

## 2021-01-11 ENCOUNTER — Encounter: Payer: Self-pay | Admitting: Family Medicine

## 2021-01-12 ENCOUNTER — Other Ambulatory Visit: Payer: Self-pay | Admitting: Family Medicine

## 2021-01-12 DIAGNOSIS — U071 COVID-19: Secondary | ICD-10-CM

## 2021-01-12 NOTE — Telephone Encounter (Signed)
Patients wife called in and was upset that no one has gotten back to her husband in regards to his covid diagnosis via MyChart. Patients wife is wanting to know if there is any medications that he can take to help. Patient is experiencing SOB, chest tightness, cough, H/A, fever, and fatigue. Advised patients wife that with these symptoms he should be seen at [facility] or ED. Patients wife stated that these symptoms are not constant and he just needs medication. Patients wife refused virtual visit. Please advise.

## 2021-01-13 ENCOUNTER — Telehealth: Payer: Self-pay

## 2021-01-13 ENCOUNTER — Telehealth: Payer: Self-pay | Admitting: Adult Health

## 2021-01-13 NOTE — Telephone Encounter (Signed)
Thank you for checking on him.

## 2021-01-13 NOTE — Telephone Encounter (Addendum)
I appreciate infusion clinic effort.  Please check on patient.  I still think it makes sense for him to have in person eval based on his prev sx.

## 2021-01-13 NOTE — Telephone Encounter (Signed)
Spoke with patient who stated that he is feeling about the same. UC and ED precautions given to patient who verbalized understanding.

## 2021-01-13 NOTE — Telephone Encounter (Signed)
Called to discuss with patient about COVID-19 symptoms and the use of one of the available treatments for those with mild to moderate Covid symptoms and at a high risk of hospitalization.  Pt appears to qualify for outpatient treatment due to co-morbid conditions and/or a member of an at-risk group in accordance with the FDA Emergency Use Authorization.    Symptom onset: 01/08/21 cough,shortness of breath,headache Vaccinated: Yes Booster? Yes Immunocompromised? Yes Qualifiers: DM,HTN  Pt. Would like to speak with APP.   Ivan Mcguire

## 2021-01-13 NOTE — Telephone Encounter (Signed)
Called to discuss with patient about COVID-19 symptoms and the use of one of the available treatments for those with mild to moderate Covid symptoms and at a high risk of hospitalization.  Pt appears to qualify for outpatient treatment due to co-morbid conditions and/or a member of an at-risk group in accordance with the FDA Emergency Use Authorization.    Patient meets criteria for treatment.  With his symptom onset of 3/18 that makes him day 6 of symptoms.  He does not meet criteria for treatment with oral therapy.  I offered and reviewed Sotrovimab, however he declined due to cost.    Ivan Mcguire

## 2021-02-16 ENCOUNTER — Other Ambulatory Visit: Payer: Self-pay | Admitting: Family Medicine

## 2021-04-12 ENCOUNTER — Telehealth: Payer: Self-pay | Admitting: Family Medicine

## 2021-04-12 DIAGNOSIS — I10 Essential (primary) hypertension: Secondary | ICD-10-CM

## 2021-04-13 NOTE — Telephone Encounter (Signed)
Refill request for Gabapentin 300 caps  LOV - 09/07/20 Next OV - not scheduled Last refill - 10/21/20 #540/1

## 2021-04-14 NOTE — Telephone Encounter (Signed)
Placed note on appointment to do A1C at OV

## 2021-04-14 NOTE — Telephone Encounter (Signed)
Called lajarvis and got him scheduled 6/28 @ 330

## 2021-04-14 NOTE — Telephone Encounter (Signed)
Sent. Thanks.  Please schedule DM2 f/u.  We can do A1c at the visit.

## 2021-04-20 ENCOUNTER — Other Ambulatory Visit: Payer: Self-pay

## 2021-04-20 ENCOUNTER — Encounter: Payer: Self-pay | Admitting: Family Medicine

## 2021-04-20 ENCOUNTER — Ambulatory Visit (INDEPENDENT_AMBULATORY_CARE_PROVIDER_SITE_OTHER): Payer: BLUE CROSS/BLUE SHIELD | Admitting: Family Medicine

## 2021-04-20 VITALS — BP 122/68 | HR 88 | Temp 98.1°F | Ht 69.0 in | Wt 239.0 lb

## 2021-04-20 DIAGNOSIS — E1149 Type 2 diabetes mellitus with other diabetic neurological complication: Secondary | ICD-10-CM | POA: Diagnosis not present

## 2021-04-20 DIAGNOSIS — G720 Drug-induced myopathy: Secondary | ICD-10-CM

## 2021-04-20 DIAGNOSIS — G4700 Insomnia, unspecified: Secondary | ICD-10-CM

## 2021-04-20 LAB — POCT GLYCOSYLATED HEMOGLOBIN (HGB A1C): Hemoglobin A1C: 6.6 % — AB (ref 4.0–5.6)

## 2021-04-20 MED ORDER — OZEMPIC (1 MG/DOSE) 4 MG/3ML ~~LOC~~ SOPN: 1.0000 mg | PEN_INJECTOR | SUBCUTANEOUS | 3 refills | Status: DC

## 2021-04-20 MED ORDER — AMITRIPTYLINE HCL 25 MG PO TABS: 25.0000 mg | ORAL_TABLET | Freq: Every day | ORAL | 3 refills | Status: DC

## 2021-04-20 NOTE — Progress Notes (Signed)
This visit occurred during the SARS-CoV-2 public health emergency.  Safety protocols were in place, including screening questions prior to the visit, additional usage of staff PPE, and extensive cleaning of exam room while observing appropriate contact time as indicated for disinfecting solutions.  Diabetes:  Using medications without difficulties: yes Hypoglycemic episodes: no Hyperglycemic episodes: no Feet problems: no change from prev baseline, historically improved with gabapentin.   Blood Sugars averaging: 130-140.   eye exam within last year: yes A1c clearly better at 6.6.   Appetite effect with ozempic has levelled off.    He'll need FMLA done to insomnia later this year. He'll update me as needed.  Taking amitriptyline 20mg  at night with relief.  D/w pt about trying 25mg  at night.    Statin intolerant.  D/w pt.     Meds, vitals, and allergies reviewed.   ROS: Per HPI unless specifically indicated in ROS section   GEN: nad, alert and oriented HEENT: ncat NECK: supple w/o LA CV: rrr. PULM: ctab, no inc wob ABD: soft, +bs EXT: no edema SKIN: no acute rash  Diabetic foot exam: Normal inspection No skin breakdown No calluses  Normal DP pulses Normal sensation to light touch and monofilament Nails normal

## 2021-04-20 NOTE — Patient Instructions (Addendum)
Try the higher dose of ozempic but if your sugar is below 120 cut out the PM metformin dose.   Stop the AM metformin dose if needed.  Take care.  Glad to see you.  Update me as needed.  If doing well, then plan on a yearly physical in the fall.  Labs ahead of time if possible.

## 2021-04-21 NOTE — Assessment & Plan Note (Signed)
He can try 25 mg of amitriptyline.  I can do FMLA paper later on if needed.

## 2021-04-21 NOTE — Assessment & Plan Note (Signed)
Statin intolerant 

## 2021-04-21 NOTE — Assessment & Plan Note (Signed)
A1c clearly better at 6.6.   Appetite effect with ozempic has levelled off.    He can try 1 mg of ozempic but if sugar is below 120 cut out the PM metformin dose.   Stop the AM metformin dose if needed.   Update me as needed.  If doing well, then plan on a yearly physical in the fall.  Labs ahead of time if possible.

## 2021-08-10 ENCOUNTER — Other Ambulatory Visit: Payer: Self-pay

## 2021-08-10 ENCOUNTER — Encounter: Payer: Self-pay | Admitting: Family Medicine

## 2021-08-10 ENCOUNTER — Ambulatory Visit: Payer: BLUE CROSS/BLUE SHIELD | Admitting: Family Medicine

## 2021-08-10 VITALS — BP 120/76 | HR 71 | Temp 98.2°F | Ht 69.0 in | Wt 243.0 lb

## 2021-08-10 DIAGNOSIS — Z1211 Encounter for screening for malignant neoplasm of colon: Secondary | ICD-10-CM

## 2021-08-10 DIAGNOSIS — E1149 Type 2 diabetes mellitus with other diabetic neurological complication: Secondary | ICD-10-CM

## 2021-08-10 DIAGNOSIS — Z23 Encounter for immunization: Secondary | ICD-10-CM

## 2021-08-10 DIAGNOSIS — R202 Paresthesia of skin: Secondary | ICD-10-CM | POA: Diagnosis not present

## 2021-08-10 DIAGNOSIS — Z0000 Encounter for general adult medical examination without abnormal findings: Secondary | ICD-10-CM

## 2021-08-10 DIAGNOSIS — Z7189 Other specified counseling: Secondary | ICD-10-CM

## 2021-08-10 DIAGNOSIS — I10 Essential (primary) hypertension: Secondary | ICD-10-CM

## 2021-08-10 DIAGNOSIS — Z0001 Encounter for general adult medical examination with abnormal findings: Secondary | ICD-10-CM

## 2021-08-10 LAB — LIPID PANEL
Cholesterol: 163 mg/dL (ref 0–200)
HDL: 38.1 mg/dL — ABNORMAL LOW (ref >39.00)
LDL Cholesterol: 85 mg/dL (ref 0–99)
NonHDL: 124.41
Total CHOL/HDL Ratio: 4
Triglycerides: 197 mg/dL — ABNORMAL HIGH (ref 0.0–149.0)
VLDL: 39.4 mg/dL (ref 0.0–40.0)

## 2021-08-10 LAB — COMPREHENSIVE METABOLIC PANEL
ALT: 31 U/L (ref 0–53)
AST: 30 U/L (ref 0–37)
Albumin: 4.3 g/dL (ref 3.5–5.2)
Alkaline Phosphatase: 92 U/L (ref 39–117)
BUN: 20 mg/dL (ref 6–23)
CO2: 24 mEq/L (ref 19–32)
Calcium: 9.4 mg/dL (ref 8.4–10.5)
Chloride: 103 mEq/L (ref 96–112)
Creatinine, Ser: 1.05 mg/dL (ref 0.40–1.50)
GFR: 76.45 mL/min (ref >60.00)
Glucose, Bld: 133 mg/dL — ABNORMAL HIGH (ref 70–99)
Potassium: 4.4 mEq/L (ref 3.5–5.1)
Sodium: 135 mEq/L (ref 135–145)
Total Bilirubin: 0.4 mg/dL (ref 0.2–1.2)
Total Protein: 7.3 g/dL (ref 6.0–8.3)

## 2021-08-10 LAB — HEMOGLOBIN A1C: Hgb A1c MFr Bld: 7 % — ABNORMAL HIGH (ref 4.6–6.5)

## 2021-08-10 LAB — VITAMIN B12: Vitamin B-12: 560 pg/mL (ref 211–911)

## 2021-08-10 LAB — TSH: TSH: 1.05 u[IU]/mL (ref 0.35–5.50)

## 2021-08-10 MED ORDER — METFORMIN HCL 1000 MG PO TABS: 1000.0000 mg | ORAL_TABLET | Freq: Two times a day (BID) | ORAL | 3 refills | Status: DC

## 2021-08-10 MED ORDER — GABAPENTIN 300 MG PO CAPS: ORAL_CAPSULE | ORAL | 3 refills | Status: DC

## 2021-08-10 NOTE — Progress Notes (Signed)
This visit occurred during the SARS-CoV-2 public health emergency.  Safety protocols were in place, including screening questions prior to the visit, additional usage of staff PPE, and extensive cleaning of exam room while observing appropriate contact time as indicated for disinfecting solutions.  CPE- See plan.  Routine anticipatory guidance given to patient.  See health maintenance.  The possibility exists that previously documented standard health maintenance information may have been brought forward from a previous encounter into this note.  If needed, that same information has been updated to reflect the current situation based on today's encounter.    Tetanus 2018 PNA 2017 Shingles d/w pt Flu shot 2022 D/w patient [id number] for colon cancer screening, including IFOB vs. colonoscopy.  Risks and benefits of both were discussed and patient voiced understanding.  patient opts for colonoscopy.  Referral placed 2022.   Prostate cancer screening and PSA options (with potential risks and benefits of testing vs not testing) were discussed along with recent recs/guidelines.  He declined testing PSA at this point. Living will d/w pt.  Wife designated if patient were incapacitated.    Diabetes:  Using medications without difficulties: yes Hypoglycemic episodes:no Hyperglycemic episodes:no Feet problems: better off concrete.  Putting up with pain at baseline, still on gabapentin and amitriptyline, those help some. Sx worse at night, esp if he has a busy day.   Blood Sugars averaging: 140-160 eye exam within last year: he is going to get eye screening done in 11/2021 Statin intolerant.   He is cut and split about 10 cords of wood.  B hand numbness episodically noted w/o weakness after running the chainsaw.  D/w pt about monitoring sx and using padded gloves to see if that helps.    Hypertension:    Using medication without problems or lightheadedness: yes Chest pain with  exertion:no Edema:no Short of breath:no  He retired and that helped his feet some, being off concrete.    PMH and SH reviewed  Meds, vitals, and allergies reviewed.   ROS: Per HPI.  Unless specifically indicated otherwise in HPI, the patient denies:  General: fever. Eyes: acute vision changes ENT: sore throat Cardiovascular: chest pain Respiratory: SOB GI: vomiting GU: dysuria Musculoskeletal: acute back pain Derm: acute rash Neuro: acute motor dysfunction Psych: worsening mood Endocrine: polydipsia Heme: bleeding Allergy: hayfever  GEN: nad, alert and oriented HEENT: ncat NECK: supple w/o LA CV: rrr. PULM: ctab, no inc wob ABD: soft, +bs EXT: no edema SKIN: no acute rash but benign appearing SKs noted on the back.   B tinel neg and grip wnl.  Sensation to light tough and monofilament wnl B hands.   Diabetic foot exam: Normal inspection No skin breakdown No calluses  Normal DP pulses Normal sensation to light touch and monofilament Nails normal

## 2021-08-10 NOTE — Patient Instructions (Addendum)
We'll call about seeing GI.  Go to the lab on the way out.   If you have mychart we'll likely use that to update you.    Take care.  Glad to see you. Flu shot today.   Shingrix- 2 doses at least 2 months apart.    Plan on recheck in about 6 months, A1c at the visit.

## 2021-08-11 ENCOUNTER — Encounter: Payer: Self-pay | Admitting: Internal Medicine

## 2021-08-13 NOTE — Assessment & Plan Note (Signed)
Continue lisinopril.  If he gets lightheaded he can cut his dose in half and update me.  See notes on labs.

## 2021-08-13 NOTE — Assessment & Plan Note (Signed)
Tetanus 2018 PNA 2017 Shingles d/w pt Flu shot 2022 D/w patient [id number] for colon cancer screening, including IFOB vs. colonoscopy.  Risks and benefits of both were discussed and patient voiced understanding.  patient opts for colonoscopy.  Referral placed 2022.   Prostate cancer screening and PSA options (with potential risks and benefits of testing vs not testing) were discussed along with recent recs/guidelines.  He declined testing PSA at this point. Living will d/w pt.  Wife designated if patient were incapacitated.

## 2021-08-13 NOTE — Assessment & Plan Note (Signed)
D/w pt about foot pain.  Putting up with pain at baseline, still on gabapentin and amitriptyline, those help some. Sx worse at night, esp if he has a busy day.  Continue gabapentin and amitriptyline, he'll update me as needed.  See notes on labs.  Blood Sugars averaging: 140-160 Continue metformin and Ozempic

## 2021-08-13 NOTE — Assessment & Plan Note (Signed)
Living will d/w pt.  Wife designated if patient were incapacitated.   ?

## 2021-08-25 LAB — HM DIABETES EYE EXAM

## 2021-08-28 ENCOUNTER — Emergency Department (HOSPITAL_COMMUNITY): Admission: EM | Admit: 2021-08-28 | Payer: BLUE CROSS/BLUE SHIELD | Source: Home / Self Care

## 2021-09-20 ENCOUNTER — Ambulatory Visit (INDEPENDENT_AMBULATORY_CARE_PROVIDER_SITE_OTHER): Payer: Self-pay | Admitting: *Deleted

## 2021-09-20 ENCOUNTER — Other Ambulatory Visit: Payer: Self-pay

## 2021-09-20 ENCOUNTER — Encounter: Payer: Self-pay | Admitting: *Deleted

## 2021-09-20 VITALS — Ht 69.0 in | Wt 243.2 lb

## 2021-09-20 DIAGNOSIS — Z1211 Encounter for screening for malignant neoplasm of colon: Secondary | ICD-10-CM

## 2021-09-20 MED ORDER — CLENPIQ 10-3.5-12 MG-GM -GM/160ML PO SOLN: 1.0000 | Freq: Once | ORAL | 0 refills | Status: AC

## 2021-09-20 NOTE — Progress Notes (Addendum)
Gastroenterology Pre-Procedure Review  Request Date: 09/20/2021 Requesting Physician: Dr. Elsie Stain @ [facility], no previous TCS  PATIENT REVIEW QUESTIONS: The patient responded to the following health history questions as indicated:    1. Diabetes Melitis: yes, type II  2. Joint replacements in the past 12 months: no 3. Major health problems in the past 3 months: no 4. Has an artificial valve or MVP: no 5. Has a defibrillator: no 6. Has been advised in past to take antibiotics in advance of a procedure like teeth cleaning: yes, had previous hip replacement about 9 years 7. Family history of colon cancer: no 8. Alcohol Use: yes, 1 beer every 2 weeks 9. Illicit drug Use: no 10. History of sleep apnea: yes, CPAP  11. History of coronary artery or other vascular stents placed within the last 12 months: no 12. History of any prior anesthesia complications: no 13. Body mass index is 35.91 kg/m.    MEDICATIONS & ALLERGIES:    Patient reports the following regarding taking any blood thinners:   Plavix? no Aspirin? no Coumadin? no Brilinta? no Xarelto? no Eliquis? no Pradaxa? no Savaysa? no Effient? no  Patient confirms/reports the following medications:  Current Outpatient Medications  Medication Sig Dispense Refill   Alpha-Lipoic Acid (LIPOIC ACID PO) Take by mouth daily.     amitriptyline (ELAVIL) 25 MG tablet Take 1 tablet (25 mg total) by mouth at bedtime. 90 tablet 3   gabapentin (NEURONTIN) 300 MG capsule 1-2 tabs up to 3 times a day if needed for pain 540 capsule 3   lisinopril (ZESTRIL) 20 MG tablet TAKE 1 TABLET BY MOUTH EVERY DAY 90 tablet 3   metFORMIN (GLUCOPHAGE) 1000 MG tablet Take 1 tablet (1,000 mg total) by mouth 2 (two) times daily with a meal. 180 tablet 3   Multiple Vitamin (MULTIVITAMIN) tablet Take 1 tablet by mouth daily.     Semaglutide, 1 MG/DOSE, (OZEMPIC, 1 MG/DOSE,) 4 MG/3ML SOPN Inject 1 mg into the skin once a week. 9 mL 3   thiamine  (VITAMIN B-1) 100 MG tablet Take 100 mg by mouth daily.     vitamin B-12 (CYANOCOBALAMIN) 1000 MCG tablet Take 1,000 mcg by mouth daily.     No current facility-administered medications for this visit.    Patient confirms/reports the following allergies:  Allergies  Allergen Reactions   Hydrocodone Itching   Pravastatin     Aches on 10mg  daily    No orders of the defined types were placed in this encounter.   AUTHORIZATION INFORMATION Primary Insurance: [place],  [place] #: [id number],  Group #: [id number] Pre-Cert / Auth required: No, not required per Vinson Moselle Pre-Cert / Auth #: REF#: [id number]  SCHEDULE INFORMATION: Procedure has been scheduled as follows:  Date: 09/28/2021, Time: 1:30  Location: [facility] with Dr. Abbey Chatters  This Gastroenterology Pre-Precedure Review Form is being routed to the following provider(s): Aliene Altes, PA-C

## 2021-09-21 NOTE — Progress Notes (Signed)
Okay to schedule.  ASA 2.  1 day prior to procedure: Take one half dose of metformin (500 mg twice daily). Day of procedure: No morning diabetes medications.

## 2021-09-22 ENCOUNTER — Encounter: Payer: Self-pay | Admitting: *Deleted

## 2021-09-22 NOTE — Progress Notes (Signed)
Spoke to pt.  Made him aware 1 day prior to procedure: Take one half dose of metformin (500 mg twice daily). Day of procedure: No morning diabetes medications.  Pt voiced understanding.  Mailed letter to pt.  He said he has my chart too.

## 2021-09-23 ENCOUNTER — Other Ambulatory Visit: Payer: Self-pay | Admitting: *Deleted

## 2021-09-28 ENCOUNTER — Ambulatory Visit (HOSPITAL_COMMUNITY): Payer: BLUE CROSS/BLUE SHIELD | Admitting: Anesthesiology

## 2021-09-28 ENCOUNTER — Ambulatory Visit (HOSPITAL_COMMUNITY)
Admission: RE | Admit: 2021-09-28 | Discharge: 2021-09-28 | Disposition: A | Discharge status: Home / Self Care | Payer: BLUE CROSS/BLUE SHIELD | Attending: Internal Medicine | Admitting: Internal Medicine

## 2021-09-28 ENCOUNTER — Other Ambulatory Visit: Payer: Self-pay

## 2021-09-28 ENCOUNTER — Encounter (HOSPITAL_COMMUNITY)
Admission: RE | Disposition: A | Discharge status: Home / Self Care | Payer: Self-pay | Source: Home / Self Care | Attending: Internal Medicine

## 2021-09-28 ENCOUNTER — Encounter (HOSPITAL_COMMUNITY): Payer: Self-pay

## 2021-09-28 DIAGNOSIS — D124 Benign neoplasm of descending colon: Secondary | ICD-10-CM | POA: Diagnosis not present

## 2021-09-28 DIAGNOSIS — Z79899 Other long term (current) drug therapy: Secondary | ICD-10-CM | POA: Insufficient documentation

## 2021-09-28 DIAGNOSIS — D122 Benign neoplasm of ascending colon: Secondary | ICD-10-CM | POA: Insufficient documentation

## 2021-09-28 DIAGNOSIS — K5730 Diverticulosis of large intestine without perforation or abscess without bleeding: Secondary | ICD-10-CM | POA: Insufficient documentation

## 2021-09-28 DIAGNOSIS — Z7984 Long term (current) use of oral hypoglycemic drugs: Secondary | ICD-10-CM | POA: Insufficient documentation

## 2021-09-28 DIAGNOSIS — E119 Type 2 diabetes mellitus without complications: Secondary | ICD-10-CM | POA: Insufficient documentation

## 2021-09-28 DIAGNOSIS — K635 Polyp of colon: Secondary | ICD-10-CM | POA: Diagnosis not present

## 2021-09-28 DIAGNOSIS — K648 Other hemorrhoids: Secondary | ICD-10-CM | POA: Diagnosis not present

## 2021-09-28 DIAGNOSIS — G4730 Sleep apnea, unspecified: Secondary | ICD-10-CM | POA: Insufficient documentation

## 2021-09-28 DIAGNOSIS — G4733 Obstructive sleep apnea (adult) (pediatric): Secondary | ICD-10-CM | POA: Diagnosis not present

## 2021-09-28 DIAGNOSIS — I10 Essential (primary) hypertension: Secondary | ICD-10-CM | POA: Insufficient documentation

## 2021-09-28 DIAGNOSIS — Z1211 Encounter for screening for malignant neoplasm of colon: Secondary | ICD-10-CM | POA: Insufficient documentation

## 2021-09-28 HISTORY — PX: POLYPECTOMY: SHX5525

## 2021-09-28 HISTORY — PX: COLONOSCOPY WITH PROPOFOL: SHX5780

## 2021-09-28 LAB — GLUCOSE, CAPILLARY: Glucose-Capillary: 128 mg/dL — ABNORMAL HIGH (ref 70–99)

## 2021-09-28 SURGERY — COLONOSCOPY WITH PROPOFOL
Anesthesia: General

## 2021-09-28 MED ORDER — LACTATED RINGERS IV SOLN
INTRAVENOUS | Status: DC
  Administered 2021-09-28: 1000 mL via INTRAVENOUS

## 2021-09-28 MED ORDER — PROPOFOL 10 MG/ML IV BOLUS
INTRAVENOUS | Status: DC | PRN
  Administered 2021-09-28: 30 mg via INTRAVENOUS
  Administered 2021-09-28: 100 mg via INTRAVENOUS
  Administered 2021-09-28: 30 mg via INTRAVENOUS
  Administered 2021-09-28 (×2): 50 mg via INTRAVENOUS
  Administered 2021-09-28: 40 mg via INTRAVENOUS

## 2021-09-28 NOTE — H&P (Signed)
Primary Care Physician:  Tonia Ghent, MD Primary Gastroenterologist:  Dr. Abbey Chatters  Pre-Procedure History & Physical: HPI:  Ivan Mcguire is a 60 y.o. male is here for a colonoscopy for colon cancer screening purposes.  Patient denies any family history of colorectal cancer.  No melena or hematochezia.  No abdominal pain or unintentional weight loss.  No change in bowel habits.  Overall feels well from a GI standpoint.  Past Medical History:  Diagnosis Date   Chronic back pain    Diabetes mellitus without complication ([place])    Gynecomastia, male    HTN (hypertension)    no med  dr Phillip Heal   pcp   Osteoarthritis of right hip 03/26/2013   Sleep apnea    cpap    last sleep study > 20 yrs    Past Surgical History:  Procedure Laterality Date   TOTAL HIP ARTHROPLASTY Right 03/26/2013   Procedure: TOTAL HIP ARTHROPLASTY;  Surgeon: Johnny Bridge, MD;  Location: [place];  Service: Orthopedics;  Laterality: Right;    Prior to Admission medications   Medication Sig Start Date End Date Taking? Authorizing Provider  Alpha-Lipoic Acid (LIPOIC ACID PO) Take 1 capsule by mouth in the morning and at bedtime.   Yes [provider]  amitriptyline (ELAVIL) 25 MG tablet Take 1 tablet (25 mg total) by mouth at bedtime. 04/20/21  Yes Tonia Ghent, MD  gabapentin (NEURONTIN) 300 MG capsule 1-2 tabs up to 3 times a day if needed for pain 08/10/21  Yes Tonia Ghent, MD  ibuprofen (ADVIL) 200 MG tablet Take 800 mg by mouth in the morning, at noon, and at bedtime.   Yes [provider]  lisinopril (ZESTRIL) 20 MG tablet TAKE 1 TABLET BY MOUTH EVERY DAY 04/13/21  Yes Tonia Ghent, MD  metFORMIN (GLUCOPHAGE) 1000 MG tablet Take 1 tablet (1,000 mg total) by mouth 2 (two) times daily with a meal. 08/10/21  Yes Tonia Ghent, MD  Multiple Vitamin (MULTIVITAMIN) tablet Take 1 tablet by mouth daily.   Yes [provider]  Semaglutide, 1 MG/DOSE, (OZEMPIC, 1 MG/DOSE,) 4 MG/3ML  SOPN Inject 1 mg into the skin once a week. Patient taking differently: Inject 1 mg into the skin every Saturday. 04/20/21  Yes Tonia Ghent, MD  thiamine (VITAMIN B-1) 100 MG tablet Take 100 mg by mouth daily.   Yes [provider]  vitamin B-12 (CYANOCOBALAMIN) 1000 MCG tablet Take 1,000 mcg by mouth daily.   Yes [provider]    Allergies as of 09/22/2021 - Review Complete 09/22/2021  Allergen Reaction Noted   Hydrocodone Itching 05/14/2015   Pravastatin  09/07/2020    Family History  Problem Relation Age of Onset   Angina Mother    CAD Father        cerebral aneurysm   Dementia Father 26   Diabetes Brother    Cancer Maternal Aunt        pancreas?   Colon cancer Neg Hx    Prostate cancer Neg Hx     Social History   Socioeconomic History   Marital status: Married    Spouse name: Not on file   Number of children: Not on file   Years of education: Not on file   Highest education level: Not on file  Occupational History   Not on file  Tobacco Use   Smoking status: Never   Smokeless tobacco: Never  Vaping Use   Vaping Use: Never used  Substance and Sexual Activity   Alcohol use: No   Drug use: No   Sexual activity: Not on file  Other Topics Concern   Not on file  Social History Narrative   Worked at [employer]    Married 1981   Social Determinants of Health   Financial Resource Strain: Not on file  Food Insecurity: Not on file  Transportation Needs: Not on file  Physical Activity: Not on file  Stress: Not on file  Social Connections: Not on file  Intimate Partner Violence: Not on file    Review of Systems: See HPI, otherwise negative ROS  Physical Exam: Vital signs in last 24 hours: Temp:  [97.7 F (36.5 C)] 97.7 F (36.5 C) (12/06 1221) Pulse Rate:  [78] 78 (12/06 1221) Resp:  [15] 15 (12/06 1221) BP: (142)/(87) 142/87 (12/06 1221) SpO2:  [96 %] 96 % (12/06 1221)   General:   Alert,  Well-developed,  well-nourished, pleasant and cooperative in NAD Head:  Normocephalic and atraumatic. Eyes:  Sclera clear, no icterus.   Conjunctiva pink. Ears:  Normal auditory acuity. Nose:  No deformity, discharge,  or lesions. Mouth:  No deformity or lesions, dentition normal. Neck:  Supple; no masses or thyromegaly. Lungs:  Clear throughout to auscultation.   No wheezes, crackles, or rhonchi. No acute distress. Heart:  Regular rate and rhythm; no murmurs, clicks, rubs,  or gallops. Abdomen:  Soft, nontender and nondistended. No masses, hepatosplenomegaly or hernias noted. Normal bowel sounds, without guarding, and without rebound.   Msk:  Symmetrical without gross deformities. Normal posture. Extremities:  Without clubbing or edema. Neurologic:  Alert and  oriented x4;  grossly normal neurologically. Skin:  Intact without significant lesions or rashes. Cervical Nodes:  No significant cervical adenopathy. Psych:  Alert and cooperative. Normal mood and affect.  Impression/Plan: Ivan Mcguire is here for a colonoscopy to be performed for colon cancer screening purposes.  The risks of the procedure including infection, bleed, or perforation as well as benefits, limitations, alternatives and imponderables have been reviewed with the patient. Questions have been answered. All parties agreeable.

## 2021-09-28 NOTE — Transfer of Care (Signed)
Immediate Anesthesia Transfer of Care Note  Patient: Ivan Mcguire  Procedure(s) Performed: COLONOSCOPY WITH PROPOFOL POLYPECTOMY  Patient Location: Endoscopy Unit  Anesthesia Type:General  Level of Consciousness: awake, alert  and oriented  Airway & Oxygen Therapy: Patient Spontanous Breathing  Post-op Assessment: Report given to RN and Post -op Vital signs reviewed and stable  Post vital signs: Reviewed and stable  Last Vitals:  Vitals Value Taken Time  BP    Temp    Pulse    Resp    SpO2      Last Pain:  Vitals:   09/28/21 1240  TempSrc:   PainSc: 0-No pain         Complications: No notable events documented.

## 2021-09-28 NOTE — Anesthesia Preprocedure Evaluation (Addendum)
Anesthesia Evaluation  Patient identified by MRN, date of birth, ID band Patient awake    Reviewed: Allergy & Precautions, H&P , NPO status , Patient's Chart, lab work & pertinent test results  Airway Mallampati: III  TM Distance: >3 FB Neck ROM: Full    Dental  (+) Dental Advisory Given [place] :   Pulmonary sleep apnea and Continuous Positive Airway Pressure Ventilation ,    Pulmonary exam normal breath sounds clear to auscultation       Cardiovascular Exercise Tolerance: Good hypertension, Pt. on medications Normal cardiovascular exam Rhythm:Regular Rate:Normal     Neuro/Psych  Neuromuscular disease negative psych ROS   GI/Hepatic negative GI ROS, Neg liver ROS,   Endo/Other  negative endocrine ROSdiabetes, Well Controlled, Type 2, Oral Hypoglycemic Agents  Renal/GU Renal disease  negative genitourinary   Musculoskeletal  (+) Arthritis  (back pain),   Abdominal   Peds negative pediatric ROS (+)  Hematology negative hematology ROS (+)   Anesthesia Other Findings   Reproductive/Obstetrics negative OB ROS                             Anesthesia Physical Anesthesia Plan  ASA: 2  Anesthesia Plan: General   Post-op Pain Management: Minimal or no pain anticipated   Induction:   PONV Risk Score and Plan: TIVA  Airway Management Planned: Nasal Cannula and Natural Airway  Additional Equipment:   Intra-op Plan:   Post-operative Plan:   Informed Consent: I have reviewed the patients History and Physical, chart, labs and discussed the procedure including the risks, benefits and alternatives for the proposed anesthesia with the patient or authorized representative who has indicated his/her understanding and acceptance.     Dental advisory given  Plan Discussed with: CRNA and Surgeon  Anesthesia Plan Comments:        Anesthesia Quick Evaluation

## 2021-09-28 NOTE — Discharge Instructions (Addendum)
  Colonoscopy Discharge Instructions  Read the instructions outlined below and refer to this sheet in the next few weeks. These discharge instructions provide you with general information on caring for yourself after you leave the hospital. Your doctor may also give you specific instructions. While your treatment has been planned according to the most current medical practices available, unavoidable complications occasionally occur.   ACTIVITY You may resume your regular activity, but move at a slower pace for the next 24 hours.  Take frequent rest periods for the next 24 hours.  Walking will help get rid of the air and reduce the bloated feeling in your belly (abdomen).  No driving for 24 hours (because of the medicine (anesthesia) used during the test).   Do not sign any important legal documents or operate any machinery for 24 hours (because of the anesthesia used during the test).  NUTRITION Drink plenty of fluids.  You may resume your normal diet as instructed by your doctor.  Begin with a light meal and progress to your normal diet. Heavy or fried foods are harder to digest and may make you feel sick to your stomach (nauseated).  Avoid alcoholic beverages for 24 hours or as instructed.  MEDICATIONS You may resume your normal medications unless your doctor tells you otherwise.  WHAT YOU CAN EXPECT TODAY Some feelings of bloating in the abdomen.  Passage of more gas than usual.  Spotting of blood in your stool or on the toilet paper.  IF YOU HAD POLYPS REMOVED DURING THE COLONOSCOPY: No aspirin products for 7 days or as instructed.  No alcohol for 7 days or as instructed.  Eat a soft diet for the next 24 hours.  FINDING OUT THE RESULTS OF YOUR TEST Not all test results are available during your visit. If your test results are not back during the visit, make an appointment with your caregiver to find out the results. Do not assume everything is normal if you have not heard from your  caregiver or the medical facility. It is important for you to follow up on all of your test results.  SEEK IMMEDIATE MEDICAL ATTENTION IF: You have more than a spotting of blood in your stool.  Your belly is swollen (abdominal distention).  You are nauseated or vomiting.  You have a temperature over 101.  You have abdominal pain or discomfort that is severe or gets worse throughout the day.   Your colonoscopy revealed 2 polyp(s) which I removed successfully. Await pathology results, my office will contact you. I recommend repeating colonoscopy in 5 years for surveillance purposes. Otherwise follow up with Gi as needed.   I hope you have a great rest of your week!  Charles K. Carver, D.O. Gastroenterology and Hepatology [facility]  

## 2021-09-28 NOTE — Op Note (Signed)
[facility] Patient Name: Ivan Mcguire Procedure Date: 09/28/2021 12:28 PM MRN: [id number] Date of Birth: June 10, 1961 Attending MD: Elon Alas. Abbey Chatters DO CSN: [id number] Age: 60 Admit Type: Outpatient Procedure:                Colonoscopy Indications:              Screening for colorectal malignant neoplasm Providers:                Elon Alas. Abbey Chatters, DO, Charlsie Quest. Theda Sers RN, RN,                            Nelma Rothman, Technician Referring MD:              Medicines:                See the Anesthesia note for documentation of the                            administered medications Complications:            No immediate complications. Estimated Blood Loss:     Estimated blood loss was minimal. Procedure:                Pre-Anesthesia Assessment:                           - The anesthesia plan was to use monitored                            anesthesia care (MAC).                           After obtaining informed consent, the colonoscope                            was passed under direct vision. Throughout the                            procedure, the patient's blood pressure, pulse, and                            oxygen saturations were monitored continuously. The                            PCF-HQ190L ([id number]) scope was introduced through                            the anus and advanced to the the cecum, identified                            by appendiceal orifice and ileocecal valve. The                            colonoscopy was performed without difficulty. The                            patient tolerated the procedure  well. The quality                            of the bowel preparation was evaluated using the                            BBPS [facility] Bowel Preparation Scale) with scores                            of: Right Colon = 2 (minor amount of residual                            staining, small fragments of stool and/or opaque                            liquid, but  mucosa seen well), Transverse Colon = 2                            (minor amount of residual staining, small fragments                            of stool and/or opaque liquid, but mucosa seen                            well) and Left Colon = 2 (minor amount of residual                            staining, small fragments of stool and/or opaque                            liquid, but mucosa seen well). The total BBPS score                            equals 6. The quality of the bowel preparation was                            fair. Scope In: 12:44:02 PM Scope Out: 1:08:56 PM Scope Withdrawal Time: 0 hours 18 minutes 13 seconds  Total Procedure Duration: 0 hours 24 minutes 54 seconds  Findings:      Hemorrhoids were found on perianal exam.      Non-bleeding internal hemorrhoids were found during endoscopy.      Multiple small and large-mouthed diverticula were found in the sigmoid       colon and descending colon.      A 5 mm polyp was found in the ascending colon. The polyp was sessile.       The polyp was removed with a cold snare. Resection and retrieval were       complete.      A 2 mm polyp was found in the descending colon. The polyp was sessile.       The polyp was removed with a cold biopsy forceps. Resection and       retrieval were complete.      The exam was otherwise without abnormality. Impression:               -  Preparation of the colon was fair.                           - Hemorrhoids found on perianal exam.                           - Non-bleeding internal hemorrhoids.                           - Diverticulosis in the sigmoid colon and in the                            descending colon.                           - One 5 mm polyp in the ascending colon, removed                            with a cold snare. Resected and retrieved.                           - One 2 mm polyp in the descending colon, removed                            with a cold biopsy forceps. Resected and  retrieved.                           - The examination was otherwise normal. Moderate Sedation:      Per Anesthesia Care Recommendation:           - Patient has a contact number available for                            emergencies. The signs and symptoms of potential                            delayed complications were discussed with the                            patient. Return to normal activities tomorrow.                            Written discharge instructions were provided to the                            patient.                           - Resume previous diet.                           - Continue present medications.                           - Await pathology results.                           -  Repeat colonoscopy in 5 years for surveillance                            and borderline colon prep. Will need extended prep                            and clear liquids on next colonoscopy.                           - Return to GI clinic PRN. Procedure Code(s):        --- Professional ---                           [phone], Colonoscopy, flexible; with removal of                            tumor(s), polyp(s), or other lesion(s) by snare                            technique                           [id number], 44, Colonoscopy, flexible; with biopsy,                            single or multiple Diagnosis Code(s):        --- Professional ---                           Z12.11, Encounter for screening for malignant                            neoplasm of colon                           K64.8, Other hemorrhoids                           K63.5, Polyp of colon                           K57.30, Diverticulosis of large intestine without                            perforation or abscess without bleeding CPT copyright 2019 American Medical Association. All rights reserved. The codes documented in this report are preliminary and upon coder review may  be revised to meet current compliance  requirements. Elon Alas. Abbey Chatters, DO [place] Abbey Chatters, DO 09/28/2021 1:12:27 PM This report has been signed electronically. Number of Addenda: 0

## 2021-09-28 NOTE — Anesthesia Postprocedure Evaluation (Signed)
Anesthesia Post Note  Patient: Ivan Mcguire  Procedure(s) Performed: COLONOSCOPY WITH PROPOFOL POLYPECTOMY  Patient location during evaluation: Endoscopy Anesthesia Type: General Level of consciousness: awake and alert and oriented Pain management: pain level controlled Vital Signs Assessment: post-procedure vital signs reviewed and stable Respiratory status: spontaneous breathing, nonlabored ventilation and respiratory function stable Cardiovascular status: blood pressure returned to baseline and stable Postop Assessment: no apparent nausea or vomiting Anesthetic complications: no   No notable events documented.   Last Vitals:  Vitals:   09/28/21 1221 09/28/21 1311  BP: (!) 142/87 102/60  Pulse: 78   Resp: 15 20  Temp: 36.5 C 36.6 C  SpO2: 96% 94%    Last Pain:  Vitals:   09/28/21 1311  TempSrc: Oral  PainSc: 0-No pain                 Holdyn Poyser C Bree Heinzelman

## 2021-09-30 LAB — SURGICAL PATHOLOGY

## 2021-10-01 ENCOUNTER — Encounter (HOSPITAL_COMMUNITY): Payer: Self-pay | Admitting: Internal Medicine

## 2021-10-12 ENCOUNTER — Ambulatory Visit: Payer: BLUE CROSS/BLUE SHIELD | Admitting: Family Medicine

## 2021-10-21 ENCOUNTER — Ambulatory Visit: Payer: BLUE CROSS/BLUE SHIELD | Admitting: Family Medicine

## 2021-10-21 ENCOUNTER — Other Ambulatory Visit: Payer: Self-pay

## 2021-10-21 ENCOUNTER — Encounter: Payer: Self-pay | Admitting: Family Medicine

## 2021-10-21 VITALS — BP 126/70 | HR 112 | Temp 97.3°F | Ht 69.0 in | Wt 240.0 lb

## 2021-10-21 DIAGNOSIS — Z23 Encounter for immunization: Secondary | ICD-10-CM | POA: Diagnosis not present

## 2021-10-21 DIAGNOSIS — E1149 Type 2 diabetes mellitus with other diabetic neurological complication: Secondary | ICD-10-CM

## 2021-10-21 MED ORDER — OMEPRAZOLE 20 MG PO CPDR: 20.0000 mg | DELAYED_RELEASE_CAPSULE | Freq: Every day | ORAL | Status: AC

## 2021-10-21 MED ORDER — METFORMIN HCL 1000 MG PO TABS: 1000.0000 mg | ORAL_TABLET | Freq: Every day | ORAL | Status: DC

## 2021-10-21 NOTE — Progress Notes (Signed)
This visit occurred during the SARS-CoV-2 public health emergency.  Safety protocols were in place, including screening questions prior to the visit, additional usage of staff PPE, and extensive cleaning of exam room while observing appropriate contact time as indicated for disinfecting solutions.  D/w pt about shingrix shot.  He had sore arm with the 1st dose but o/w tolerated well.  Second shingles shot done at office visit today.  "Dizziness" noted after colonoscopy 2 weeks ago.  He had sensation of motion/imbalance but the room didn't spin.  Could happen with laying down.  Could happen with standing up.  Got better in the meantime.  No presyncope.  No sx today.  No change in gabapentin use.    GERD.  More sx at night.  No exertional pain.  More burping and flatus.  No FCNAV.  Started taking prilosec OTC and that helped.  No blood in stool.  No diarrhea.  No abd pain.  No sig change in diet to explain sx.  It doesn't appear to be related to ozempic use- it usually doesn't happen the day after that.  Is on metformin , d/w pt.    DM2. Sugar has been ~120 or lower recently.  Elevated heart rate but he had 4 cups of coffee this AM and pulse is RRR o/w, not irregular.    Meds, vitals, and allergies reviewed.   ROS: Per HPI unless specifically indicated in ROS section   GEN: nad, alert and oriented HEENT: ncat NECK: supple w/o LA CV: RRR except for mild tachycardia- no ectopy  PULM: ctab, no inc wob ABD: soft, +bs EXT: no edema SKIN: well perfused.    Recheck pulse 107 and regular.    30 minutes were devoted to patient care in this encounter (this includes time spent reviewing the patient's file/history, interviewing and examining the patient, counseling/reviewing plan with patient).

## 2021-10-21 NOTE — Patient Instructions (Addendum)
2nd shingles shot today.  Try cutting the metformin back to 1 pill a day, in the AM.   See if the GI symptoms get better.  Okay to continue prilosec for now.  Update me in about 2 weeks, sooner if needed.  See if the symptoms are more likely to happen after your shot on Saturdays.   We'll go from there.   Take care.  Glad to see you.  If your pulse stays above 100 then let me know.

## 2021-10-25 NOTE — Assessment & Plan Note (Addendum)
Discussed options.  Could have GI symptoms related to metformin.  Reasonable to try cutting the metformin back to 1 pill a day, in the AM.   I asked him to see if the GI symptoms get better.  Okay to continue prilosec for now.  I asked him to update me in about 2 weeks, sooner if needed.  I want him to see if the symptoms are more likely to happen after his dose of Ozempic on Saturdays.   We'll go from there.    I asked him to let me know if his tachycardia continues as an outpatient.  He can update me as needed and he can check his pulse at home.  I think this likely related to coffee this morning. Dizziness could have been exacerbated by relative hypovolemia after colonoscopy but that has resolved in the meantime.

## 2022-03-02 ENCOUNTER — Other Ambulatory Visit: Payer: Self-pay | Admitting: Family Medicine

## 2022-04-09 ENCOUNTER — Other Ambulatory Visit: Payer: Self-pay | Admitting: Family Medicine

## 2022-04-09 DIAGNOSIS — I10 Essential (primary) hypertension: Secondary | ICD-10-CM

## 2022-05-03 ENCOUNTER — Encounter: Payer: Self-pay | Admitting: Family Medicine

## 2022-05-04 ENCOUNTER — Telehealth: Payer: Self-pay | Admitting: Family Medicine

## 2022-05-04 NOTE — Telephone Encounter (Signed)
Please call patient and check with him about his insurance company/med coverage and let me know what you can find out.  See my chart message.  Thanks.

## 2022-05-05 NOTE — Telephone Encounter (Signed)
Replied to patients mychart message to see if he already knows what his new insurance will be.

## 2022-05-09 NOTE — Telephone Encounter (Signed)
Patient does not know his insurance yet; he has to choose a plan.

## 2022-05-23 ENCOUNTER — Other Ambulatory Visit: Payer: Self-pay | Admitting: Family Medicine

## 2022-07-06 ENCOUNTER — Other Ambulatory Visit: Payer: Self-pay | Admitting: Family Medicine

## 2022-07-06 DIAGNOSIS — I10 Essential (primary) hypertension: Secondary | ICD-10-CM

## 2022-07-17 ENCOUNTER — Other Ambulatory Visit: Payer: Self-pay

## 2022-07-17 ENCOUNTER — Ambulatory Visit
Admission: RE | Admit: 2022-07-17 | Discharge: 2022-07-17 | Disposition: A | Discharge status: Home / Self Care | Payer: 59 | Source: Ambulatory Visit | Attending: Physician Assistant | Admitting: Physician Assistant

## 2022-07-17 VITALS — BP 138/80 | HR 83 | Temp 97.7°F | Resp 20

## 2022-07-17 DIAGNOSIS — H5789 Other specified disorders of eye and adnexa: Secondary | ICD-10-CM | POA: Diagnosis not present

## 2022-07-17 MED ORDER — POLYMYXIN B-TRIMETHOPRIM 10000-0.1 UNIT/ML-% OP SOLN
1.0000 [drp] | OPHTHALMIC | 0 refills | Status: AC
Start: 2022-07-17 — End: 2022-07-24

## 2022-07-17 MED ORDER — PREDNISONE 20 MG PO TABS: 40.0000 mg | ORAL_TABLET | Freq: Every day | ORAL | 0 refills | Status: AC

## 2022-07-17 NOTE — ED Provider Notes (Signed)
[facility]    CSN: [id number] Arrival date & time: 07/17/22  0957      History   Chief Complaint Chief Complaint  Patient presents with   Eye Problem   Appointment    10:00    HPI Ivan Mcguire is a 61 y.o. male.   Patient here today for evaluation of swelling below his left eye that started 4 days ago.  He reports that initially thought he was developing a stye as he has had some swelling initially to the inner corner of his left eye however swelling has seemed to progress with time.  He does have some mild erythema and soreness to the inner corner of his eye but denies pain otherwise.  He does not report any vision changes.  He has not had fever.  He has tried using warm cloths and allergy eyedrop without significant improvement.  The history is provided by the patient.  Eye Problem Associated symptoms: no discharge, no itching, no numbness, no photophobia and no redness     Past Medical History:  Diagnosis Date   Chronic back pain    Diabetes mellitus without complication (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   Paresthesia 09/09/2020   Kidney stones 10/06/2019   Insomnia 10/06/2019   HLD (hyperlipidemia) 02/20/2019   Advance care planning 12/31/2017   Encounter for general adult medical examination with abnormal findings 06/28/2017   Right leg pain 11/23/2016   Skin lesion 10/26/2016   HTN (hypertension) 07/08/2016   Diabetes mellitus type 2 with neurological manifestations ([place]) 07/08/2016   Osteoarthritis of right hip 03/26/2013   Preoperative clearance 03/20/2013   Knee pain 01/14/2013   OSA on CPAP 04/04/2011   Osteoporosis 04/04/2011   Fatty liver 04/04/2011   Gynecomastia 03/24/2011   Groin pain, right 03/24/2011     Past Surgical History:  Procedure Laterality Date   COLONOSCOPY WITH PROPOFOL N/A 09/28/2021   Procedure: COLONOSCOPY WITH PROPOFOL;  Surgeon: Eloise Harman, DO;  [facility];  Service: Endoscopy;  Laterality: N/A;  1:30 / ASA II   POLYPECTOMY  09/28/2021   Procedure: POLYPECTOMY;  Surgeon: Eloise Harman, DO;  Location: [facility];  Service: Endoscopy;;   TOTAL HIP ARTHROPLASTY Right 03/26/2013   Procedure: TOTAL HIP ARTHROPLASTY;  Surgeon: Johnny Bridge, MD;  Location: [place];  Service: Orthopedics;  Laterality: Right;       Home Medications    Prior to Admission medications   Medication Sig Start Date End Date Taking? Authorizing Provider  predniSONE (DELTASONE) 20 MG tablet Take 2 tablets (40 mg total) by mouth daily with breakfast for 5 days. 07/17/22 07/22/22 Yes Francene Finders, PA-C  trimethoprim-polymyxin b (POLYTRIM) ophthalmic solution Place 1 drop into the left eye every 4 (four) hours for 7 days. 07/17/22 07/24/22 Yes Francene Finders, PA-C  Alpha-Lipoic Acid (LIPOIC ACID PO) Take 1 capsule by mouth in the morning and at bedtime.    [provider]  amitriptyline (ELAVIL) 25 MG tablet TAKE 1 TABLET BY MOUTH EVERYDAY AT BEDTIME 07/06/22   Tonia Ghent, MD  gabapentin (NEURONTIN) 300 MG capsule 1-2 tabs up to 3 times  a day if needed for pain 08/10/21   Tonia Ghent, MD  ibuprofen (ADVIL) 200 MG tablet Take 800 mg by mouth in the morning, at noon, and at bedtime.    [provider]  lisinopril (ZESTRIL) 20 MG tablet TAKE 1 TABLET BY MOUTH EVERY DAY 07/06/22   Tonia Ghent, MD  metFORMIN (GLUCOPHAGE) 1000 MG tablet Take 1 tablet (1,000 mg total) by mouth daily with breakfast. 10/21/21   Tonia Ghent, MD  Multiple Vitamin (MULTIVITAMIN) tablet Take 1 tablet by mouth daily.    [provider]  omeprazole (PRILOSEC) 20 MG capsule Take 1 capsule (20 mg total) by mouth daily. 10/21/21   Tonia Ghent, MD  OZEMPIC, 1  MG/DOSE, 4 MG/3ML SOPN INJECT 1 MG INTO THE SKIN ONE TIME PER WEEK 05/24/22   Tonia Ghent, MD  thiamine (VITAMIN B-1) 100 MG tablet Take 100 mg by mouth daily.    [provider]  vitamin B-12 (CYANOCOBALAMIN) 1000 MCG tablet Take 1,000 mcg by mouth daily.    [provider]    Family History Family History  Problem Relation Age of Onset   Angina Mother    CAD Father        cerebral aneurysm   Dementia Father 42   Diabetes Brother    Cancer Maternal Aunt        pancreas?   Colon cancer Neg Hx    Prostate cancer Neg Hx     Social History Social History   Tobacco Use   Smoking status: Never   Smokeless tobacco: Never  Vaping Use   Vaping Use: Never used  Substance Use Topics   Alcohol use: No   Drug use: No     Allergies   Hydrocodone and Pravastatin   Review of Systems Review of Systems  Constitutional:  Negative for chills and fever.  Eyes:  Negative for photophobia, discharge, redness, itching and visual disturbance.  Respiratory:  Negative for shortness of breath.   Skin:  Positive for color change and wound.  Neurological:  Negative for numbness.     Physical Exam Triage Vital Signs ED Triage Vitals  Enc Vitals Group     BP 07/17/22 1010 138/80     Pulse Rate 07/17/22 1010 83     Resp 07/17/22 1010 20     Temp 07/17/22 1010 97.7 F (36.5 C)     Temp Source 07/17/22 1010 Oral     SpO2 07/17/22 1010 94 %     Weight --      Height --      Head Circumference --      Peak Flow --      Pain Score 07/17/22 1009 1     Pain Loc --      Pain Edu? --      Excl. in [place]? --    No data found.  Updated Vital Signs BP 138/80 (BP Location: Right Arm)   Pulse 83   Temp 97.7 F (36.5 C) (Oral)   Resp 20   SpO2 94%     Physical Exam Vitals and nursing note reviewed.  Constitutional:      General: He is not in acute distress.    Appearance: Normal appearance. He is not ill-appearing.  HENT:     Head: Normocephalic and atraumatic.   Eyes:     Extraocular Movements: Extraocular movements intact.     Conjunctiva/sclera: Conjunctivae normal.     Pupils: Pupils are equal, round,  and reactive to light.     Comments: Diffuse swelling noted below the left eye, mild erythema associated with swelling to left lower inner corner.  No active drainage noted.  Cardiovascular:     Rate and Rhythm: Normal rate.  Pulmonary:     Effort: Pulmonary effort is normal.  Neurological:     Mental Status: He is alert.  Psychiatric:        Mood and Affect: Mood normal.        Behavior: Behavior normal.        Thought Content: Thought content normal.      UC Treatments / Results  Labs (all labs ordered are listed, but only abnormal results are displayed) Labs Reviewed - No data to display  EKG   Radiology No results found.  Procedures Procedures (including critical care time)  Medications Ordered in UC Medications - No data to display  Initial Impression / Assessment and Plan / UC Course  I have reviewed the triage vital signs and the nursing notes.  Pertinent labs & imaging results that were available during my care of the patient were reviewed by me and considered in my medical decision making (see chart for details).    Questionable blocked tear duct given presentation, recommended continued warm compresses and antibiotic drop prescribed along with oral steroids to help with inflammation.  Encouraged follow-up if no gradual improvement or with any further concerns.  Final Clinical Impressions(s) / UC Diagnoses   Final diagnoses:  Swelling of left eye     Discharge Instructions      Continue warm compresses and follow up if no gradual improvement or with any worsening.      ED Prescriptions     Medication Sig Dispense Auth. Provider   trimethoprim-polymyxin b (POLYTRIM) ophthalmic solution Place 1 drop into the left eye every 4 (four) hours for 7 days. 10 mL Francene Finders, PA-C   predniSONE (DELTASONE)  20 MG tablet Take 2 tablets (40 mg total) by mouth daily with breakfast for 5 days. 10 tablet Francene Finders, PA-C      PDMP not reviewed this encounter.   Francene Finders, PA-C 07/17/22 1055

## 2022-07-17 NOTE — ED Triage Notes (Signed)
Onset 4 days ago of left eye issues.  Patient thought he was developing a stye.  Patient denies any particular pain, slight soreness in the inner corner of left eye.  Swelling below left eye, swollen area is slightly red.  Corner of eye, lower lid has a bump.  Patient has been using warm cloths and an allergy eye drop.

## 2022-07-17 NOTE — Discharge Instructions (Addendum)
Continue warm compresses and follow up if no gradual improvement or with any worsening.

## 2022-08-02 ENCOUNTER — Other Ambulatory Visit: Payer: Self-pay | Admitting: Family Medicine

## 2022-08-02 NOTE — Telephone Encounter (Signed)
Trulicity is no longer cover by insurance and pharmacy is requesting something else to be called in.

## 2022-08-03 NOTE — Telephone Encounter (Signed)
LMTCB

## 2022-08-03 NOTE — Telephone Encounter (Signed)
Lurline Hare- please see about getting f/u scheduled.    LF- are there options here that would be covered?

## 2022-08-03 NOTE — Telephone Encounter (Signed)
If Ozempic 1 mg is on backorder, we could send 0.5 mg dose so he can at least have something

## 2022-08-03 NOTE — Telephone Encounter (Signed)
If Trulicity is not covered, Ozempic should be. I see she has an Rx on file for Ozempic 1 mg - eRx was sent in August for 90 ds with 1 RF - is pharmacy able to fill this?

## 2022-08-08 NOTE — Telephone Encounter (Signed)
Looks like rx got confused since pharmacy was trying to change the prescription. It's ozempic that is no longer covered by insurance. Spoke with patient and he is requesting we do a PA on this. Anda Kraft, please start PA on ozempic for patient.

## 2022-08-09 ENCOUNTER — Telehealth: Payer: Self-pay

## 2022-08-09 MED ORDER — TRULICITY 3 MG/0.5ML ~~LOC~~ SOAJ: 3.0000 mg | SUBCUTANEOUS | 3 refills | Status: DC

## 2022-08-09 NOTE — Telephone Encounter (Signed)
PA started; see phone note.

## 2022-08-09 NOTE — Telephone Encounter (Signed)
Prior auth started for Ozempic (1 MG/DOSE) '4MG'$ /3ML pen-injectors. Ivan Mcguire (Key: [id number]) Waiting for determination.

## 2022-08-09 NOTE — Telephone Encounter (Signed)
FYI - Patient was previously stable on Ozempic 1 mg - PA recently denied, no longer on formulary. The equivalent dose of Trulicity is 3 mg. Updated and sent Rx.

## 2022-08-09 NOTE — Telephone Encounter (Signed)
Alternative Trulicity sent instead. Pt previously stable on Ozempic 1 mg weekly. Equivalent dose of Trulicity is 3 mg. Order sent.

## 2022-08-09 NOTE — Addendum Note (Signed)
Addended by: Charlton Haws on: 08/09/2022 03:41 PM   Modules accepted: Orders

## 2022-08-09 NOTE — Telephone Encounter (Signed)
Prior auth for Ozempic (1 MG/DOSE) '4MG'$ /3ML pen-injectors has been denied. Ivan Mcguire (Key: [id number]) The policy states that this medication may be approved when: -The member has a clinical condition or needs a specific dosage form for which there is no alternative on the formulary OR -The listed formulary alternatives are not recommended based on published guidelines or clinical literature OR -The formulary alternatives will likely be ineffective or less effective for the member OR -The formulary alternatives will likely cause an adverse effect OR -The member is unable to take the required number of formulary alternatives for the given diagnosis due to a trial and inadequate treatment response or contraindication OR -The member has tried and failed the required number of formulary alternatives. Based on the policy and the information we have, your request is denied. We did not receive any documentation that you meet any of the criteria outlined above. Formulary alternative(s) are Victoza, Trulicity. Requirement: 3 in a class with 3 or more alternatives, 2 in a class with 2 alternatives, or 1 in a class with only 1 alternative  Denial letter sent to scanning.

## 2022-08-10 ENCOUNTER — Telehealth: Payer: Self-pay

## 2022-08-10 NOTE — Telephone Encounter (Signed)
Prior auth started for Trulicity '3MG'$ /0.5ML pen-injectors. Maryclare Labrador Key: [id number] - PA Case ID: [id number] - Rx #: [id number] Waiting for determination.

## 2022-08-10 NOTE — Telephone Encounter (Signed)
Noted. Thanks.

## 2022-08-10 NOTE — Telephone Encounter (Signed)
Prior auth for Trulicity '3MG'$ /0.5ML pen-injectors has been approved. [place] Key: [id number] - PA Case ID: [id number] - Rx #: [id number]  We're pleased to let you know that we've approved your or your doctor's request for coverage for TRULICITY INJ 7/5.4.  As long as you remain covered by your prescription drug plan and there are no changes to your plan benefits, this request is approved from 08/10/2022 to 08/11/2023.  Patient notified via mychart.  Approval letter sent to scanning.

## 2022-08-19 ENCOUNTER — Other Ambulatory Visit: Payer: Self-pay | Admitting: Family Medicine

## 2022-08-19 NOTE — Telephone Encounter (Signed)
Patient is due to CPE; please call to schedule patient.

## 2022-08-19 NOTE — Telephone Encounter (Signed)
LVM for patient to call and schedule

## 2022-08-19 NOTE — Telephone Encounter (Signed)
LVM to schedule appt

## 2022-09-04 ENCOUNTER — Other Ambulatory Visit: Payer: Self-pay | Admitting: Family Medicine

## 2022-09-04 DIAGNOSIS — R202 Paresthesia of skin: Secondary | ICD-10-CM

## 2022-09-04 DIAGNOSIS — M810 Age-related osteoporosis without current pathological fracture: Secondary | ICD-10-CM

## 2022-09-04 DIAGNOSIS — E1149 Type 2 diabetes mellitus with other diabetic neurological complication: Secondary | ICD-10-CM

## 2022-09-06 ENCOUNTER — Other Ambulatory Visit (INDEPENDENT_AMBULATORY_CARE_PROVIDER_SITE_OTHER): Payer: 59

## 2022-09-06 DIAGNOSIS — E1149 Type 2 diabetes mellitus with other diabetic neurological complication: Secondary | ICD-10-CM | POA: Diagnosis not present

## 2022-09-06 DIAGNOSIS — R202 Paresthesia of skin: Secondary | ICD-10-CM | POA: Diagnosis not present

## 2022-09-06 DIAGNOSIS — M810 Age-related osteoporosis without current pathological fracture: Secondary | ICD-10-CM | POA: Diagnosis not present

## 2022-09-06 LAB — LIPID PANEL
Cholesterol: 158 mg/dL (ref 0–200)
HDL: 37.1 mg/dL — ABNORMAL LOW (ref >39.00)
LDL Cholesterol: 85 mg/dL (ref 0–99)
NonHDL: 121.04
Total CHOL/HDL Ratio: 4
Triglycerides: 181 mg/dL — ABNORMAL HIGH (ref 0.0–149.0)
VLDL: 36.2 mg/dL (ref 0.0–40.0)

## 2022-09-06 LAB — COMPREHENSIVE METABOLIC PANEL
ALT: 29 U/L (ref 0–53)
AST: 30 U/L (ref 0–37)
Albumin: 4.3 g/dL (ref 3.5–5.2)
Alkaline Phosphatase: 80 U/L (ref 39–117)
BUN: 23 mg/dL (ref 6–23)
CO2: 27 mEq/L (ref 19–32)
Calcium: 9.1 mg/dL (ref 8.4–10.5)
Chloride: 106 mEq/L (ref 96–112)
Creatinine, Ser: 1.09 mg/dL (ref 0.40–1.50)
GFR: 72.55 mL/min (ref >60.00)
Glucose, Bld: 117 mg/dL — ABNORMAL HIGH (ref 70–99)
Potassium: 4.2 mEq/L (ref 3.5–5.1)
Sodium: 141 mEq/L (ref 135–145)
Total Bilirubin: 0.4 mg/dL (ref 0.2–1.2)
Total Protein: 7.1 g/dL (ref 6.0–8.3)

## 2022-09-06 LAB — HEMOGLOBIN A1C: Hgb A1c MFr Bld: 6.8 % — ABNORMAL HIGH (ref 4.6–6.5)

## 2022-09-06 LAB — VITAMIN D 25 HYDROXY (VIT D DEFICIENCY, FRACTURES): VITD: 32.76 ng/mL (ref 30.00–100.00)

## 2022-09-06 LAB — VITAMIN B12: Vitamin B-12: 417 pg/mL (ref 211–911)

## 2022-09-06 LAB — TSH: TSH: 3.94 u[IU]/mL (ref 0.35–5.50)

## 2022-09-12 ENCOUNTER — Encounter: Payer: Self-pay | Admitting: Family Medicine

## 2022-09-12 ENCOUNTER — Ambulatory Visit (INDEPENDENT_AMBULATORY_CARE_PROVIDER_SITE_OTHER): Payer: 59 | Admitting: Family Medicine

## 2022-09-12 VITALS — BP 122/78 | HR 77 | Temp 97.6°F | Ht 69.0 in | Wt 232.0 lb

## 2022-09-12 DIAGNOSIS — Z0000 Encounter for general adult medical examination without abnormal findings: Secondary | ICD-10-CM

## 2022-09-12 DIAGNOSIS — G720 Drug-induced myopathy: Secondary | ICD-10-CM

## 2022-09-12 DIAGNOSIS — I10 Essential (primary) hypertension: Secondary | ICD-10-CM

## 2022-09-12 DIAGNOSIS — E1149 Type 2 diabetes mellitus with other diabetic neurological complication: Secondary | ICD-10-CM

## 2022-09-12 DIAGNOSIS — Z7189 Other specified counseling: Secondary | ICD-10-CM

## 2022-09-12 MED ORDER — LISINOPRIL 20 MG PO TABS: 20.0000 mg | ORAL_TABLET | Freq: Every day | ORAL | 3 refills | Status: DC

## 2022-09-12 MED ORDER — AMITRIPTYLINE HCL 25 MG PO TABS: ORAL_TABLET | ORAL | 3 refills | Status: DC

## 2022-09-12 MED ORDER — METFORMIN HCL 1000 MG PO TABS: 1000.0000 mg | ORAL_TABLET | Freq: Two times a day (BID) | ORAL | 3 refills | Status: DC

## 2022-09-12 MED ORDER — GABAPENTIN 300 MG PO CAPS: ORAL_CAPSULE | ORAL | 3 refills | Status: DC

## 2022-09-12 NOTE — Progress Notes (Unsigned)
CPE- See plan.  Routine anticipatory guidance given to patient.  See health maintenance.  The possibility exists that previously documented standard health maintenance information may have been brought forward from a previous encounter into this note.  If needed, that same information has been updated to reflect the current situation based on today's encounter.    Tetanus 2018 PNA 2017 Shingles prev done.   Flu shot to be done at pharmacy.   Covid prev done.  Colonoscopy 2022 Prostate cancer screening and PSA options (with potential risks and benefits of testing vs not testing) were discussed along with recent recs/guidelines.  He declined testing PSA at this point. Living will d/w pt.  Wife designated if patient were incapacitated.    Diabetes:  Using medications without difficulties: changed to trulicity, some diarrhea vs constipation.  He can tolerate as is.  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   Blood Sugars averaging: ~120  eye exam within last year: to be done when possible.   Statin intolerant.    Hypertension:    Using medication without problems or lightheadedness:  yes Chest pain with exertion:no Edema:no Short of breath:no  PMH and SH reviewed  Meds, vitals, and allergies reviewed.   ROS: Per HPI.  Unless specifically indicated otherwise in HPI, the patient denies:  General: fever. Eyes: acute vision changes ENT: sore throat Cardiovascular: chest pain Respiratory: SOB GI: vomiting GU: dysuria Musculoskeletal: acute back pain Derm: acute rash Neuro: acute motor dysfunction Psych: worsening mood Endocrine: polydipsia Heme: bleeding Allergy: hayfever  GEN: nad, alert and oriented HEENT: mucous membranes moist, R medial lower eyelid irritation.  (D/w pt about trying warm compresses) NECK: supple w/o LA CV: rrr. PULM: ctab, no inc wob ABD: soft, +bs EXT: no edema SKIN:  no acute rash  Diabetic foot exam: Normal inspection No skin breakdown No calluses  Normal DP pulses Normal sensation to light touch and monofilament Nails normal

## 2022-09-12 NOTE — Patient Instructions (Signed)
Don't change your meds for now.  Recheck A1c in about 6 months, summer of 2024.  You may be able to get it drawn closer to home.  Plan on a recheck in early 2025 at a yearly visit.  Take care.  Glad to see you.

## 2022-09-14 DIAGNOSIS — Z0000 Encounter for general adult medical examination without abnormal findings: Secondary | ICD-10-CM | POA: Insufficient documentation

## 2022-09-14 NOTE — Assessment & Plan Note (Signed)
Statin intolerant 

## 2022-09-14 NOTE — Assessment & Plan Note (Signed)
Tetanus 2018 PNA 2017 Shingles prev done.   Flu shot to be done at pharmacy.   Covid prev done.  Colonoscopy 2022 Prostate cancer screening and PSA options (with potential risks and benefits of testing vs not testing) were discussed along with recent recs/guidelines.  He declined testing PSA at this point. Living will d/w pt.  Wife designated if patient were incapacitated.

## 2022-09-14 NOTE — Assessment & Plan Note (Signed)
Living will d/w pt.  Wife designated if patient were incapacitated.   ?

## 2022-09-14 NOTE — Assessment & Plan Note (Signed)
Statin intolerant.  Continue Trulicity and metformin continue work on diet and exercise.  Recheck in about 6 months with A1c.  He may be able to get that drawn at a lab closer to home.  He will update me as needed in the meantime.

## 2022-09-14 NOTE — Assessment & Plan Note (Addendum)
Continue lisinopril.  Labs discussed with patient.  He will update me as needed.  See after visit summary.

## 2022-11-23 ENCOUNTER — Other Ambulatory Visit: Payer: Self-pay | Admitting: Family Medicine

## 2022-11-29 ENCOUNTER — Other Ambulatory Visit: Payer: Self-pay | Admitting: Family Medicine

## 2022-12-28 ENCOUNTER — Other Ambulatory Visit: Payer: Self-pay | Admitting: Family Medicine

## 2023-03-10 ENCOUNTER — Encounter: Payer: Self-pay | Admitting: Family Medicine

## 2023-03-10 ENCOUNTER — Telehealth: Payer: Self-pay | Admitting: Family Medicine

## 2023-03-10 ENCOUNTER — Ambulatory Visit: Payer: 59 | Admitting: Family Medicine

## 2023-03-10 VITALS — BP 120/62 | HR 83 | Temp 97.7°F | Ht 69.0 in | Wt 233.0 lb

## 2023-03-10 DIAGNOSIS — Z7984 Long term (current) use of oral hypoglycemic drugs: Secondary | ICD-10-CM | POA: Diagnosis not present

## 2023-03-10 DIAGNOSIS — G720 Drug-induced myopathy: Secondary | ICD-10-CM

## 2023-03-10 DIAGNOSIS — Z7985 Long-term (current) use of injectable non-insulin antidiabetic drugs: Secondary | ICD-10-CM

## 2023-03-10 DIAGNOSIS — E1149 Type 2 diabetes mellitus with other diabetic neurological complication: Secondary | ICD-10-CM

## 2023-03-10 LAB — POCT GLYCOSYLATED HEMOGLOBIN (HGB A1C): Hemoglobin A1C: 6.7 % — AB (ref 4.0–5.6)

## 2023-03-10 NOTE — Patient Instructions (Addendum)
Ask the front about the eye exam dates.   Recheck in about 6 months at a yearly visit.  Labs at or before the visit. Take care.  Glad to see you.

## 2023-03-10 NOTE — Telephone Encounter (Signed)
Please notify patient that we will have our team reach out to him to schedule an appointment time within our clinic.

## 2023-03-10 NOTE — Progress Notes (Unsigned)
Diabetes:  Using medications without difficulties: medically yes except for occ bloating.   Hypoglycemic episodes: no sx Hyperglycemic episodes: no sx Feet problems: no change in baseline tingling, some days more than others but overall not changed.   Blood Sugars averaging: not checked often.   eye exam within last year: due He had been on trulicity and most recent rx was going to cost $440.  He has a few doses left.   He has been tapering ibuprofen to 400mg  BID.    Meds, vitals, and allergies reviewed.   ROS: Per HPI unless specifically indicated in ROS section   GEN: nad, alert and oriented HEENT: ncat NECK: supple w/o LA CV: rrr. PULM: ctab, no inc wob ABD: soft, +bs EXT: no edema SKIN: well perfused.   A1c 6.7.  d/w pt at OV.

## 2023-03-10 NOTE — Telephone Encounter (Signed)
Pt advised. Thanks!  

## 2023-03-10 NOTE — Telephone Encounter (Signed)
Pt states he was told by Para March to schedule a diabetic eye exam. Can you help with this? Thanks!

## 2023-03-12 NOTE — Assessment & Plan Note (Addendum)
The best option would be to continue Trulicity along with metformin.  Statin intolerant.  I am going to ask for pharmacy help.  See after visit summary.  Continue work on diet and exercise.  Recheck periodically.

## 2023-03-12 NOTE — Assessment & Plan Note (Signed)
Statin intolerant 

## 2023-03-13 ENCOUNTER — Telehealth: Payer: Self-pay

## 2023-03-13 NOTE — Progress Notes (Unsigned)
   Care Guide Note  03/13/2023 Name: Shia Frickey MRN: [id number] DOB: 04/07/1961  Referred by: Joaquim Nam, MD Reason for referral : Care Coordination (Outreach to schedule referral with pharm d )   Sim Trundle is a 62 y.o. year old male who is a primary care patient of Joaquim Nam, MD. [name] was referred to the pharmacist for assistance related to HTN and DM.    An unsuccessful telephone outreach was attempted today to contact the patient who was referred to the pharmacy team for assistance with medication assistance. Additional attempts will be made to contact the patient.   Penne Lash, RMA Care Guide [facility]  [place], [place] [address] Direct Dial: [phone] [email or web address]

## 2023-03-14 ENCOUNTER — Telehealth: Payer: Self-pay | Admitting: Family Medicine

## 2023-03-14 NOTE — Telephone Encounter (Signed)
Looks like you called him yesterday

## 2023-03-14 NOTE — Telephone Encounter (Signed)
Patient contacted the office returning a call to discuss Trulicity pricing. Patient wanted to discuss ways to gt this medication cheaper, advised patient I could not find a phone note where someone may have called him but I would send a message regarding this. Please advise, thank you.

## 2023-03-15 NOTE — Progress Notes (Signed)
   Care Guide Note  03/15/2023 Name: Ivan Mcguire MRN: [id number] DOB: 1960/12/09  Referred by: Joaquim Nam, MD Reason for referral : Care Coordination (Outreach to schedule referral with pharm d )   Ivan Mcguire is a 62 y.o. year old male who is a primary care patient of Joaquim Nam, MD. [name] was referred to the pharmacist for assistance related to DM.    Successful contact was made with the patient to discuss pharmacy services including being ready for the pharmacist to call at least 5 minutes before the scheduled appointment time, to have medication bottles and any blood sugar or blood pressure readings ready for review. The patient agreed to meet with the pharmacist via with the pharmacist via telephone visit on (date/time).  03/24/2023  Penne Lash, RMA Care Guide [facility]  [place], [place] [address] Direct Dial: [phone] [email or web address]

## 2023-03-24 ENCOUNTER — Other Ambulatory Visit: Payer: BLUE CROSS/BLUE SHIELD | Admitting: Pharmacist

## 2023-03-24 NOTE — Progress Notes (Unsigned)
03/24/2023 Name: Ivan Mcguire MRN: [id number] DOB: 08-07-1961  Chief Complaint  Patient presents with   Medication Management   Diabetes   Hypertension    Ivan Mcguire is a 62 y.o. year old male who presented for a telephone visit.   They were referred to the pharmacist by their PCP for assistance in managing diabetes.    Subjective:  Care Team: Primary Care Provider: Joaquim Nam, MD ; Next Scheduled Visit: not scheduled  Medication Access/Adherence  Current Pharmacy:  [id number] - [place], [place] - [address] AT [facility] [address] [place] [address] Phone: [phone] Fax: [phone]  [facility] - [place], [place] - [facility] AT Portal to Registered Caremark Sites [address] [place] [address] Phone: [phone] Fax: [phone]   Patient reports affordability concerns with their medications: Yes  Patient reports access/transportation concerns to their pharmacy: Yes  Patient reports adherence concerns with their medications:  No    Reports he had been on Ozempic, tolerating well, but insurance formulary changed. Prefers Victoza and Trulicity. Has been on Trulicity and using a Trulicity savings card in combination with insurance, but savings card has reached the max $1800 calendar year benefit. He has one pen left. Additionally, Trulicity 3 mg dose is experiencing shortages at this time  Discussed income. He is unfortunately over the income limit for Medicaid.    Diabetes:  Current medications: metformin 1000 mg twice daily, Trulicity 3 mg weekly Medications tried in the past: Ozempic - formulary changed  Reports he was better able to tolerate Ozempic than Trulicity.   No history of SGLT2  Hypertension:  Current medications: lisinopril 20 mg daily  Hyperlipidemia/ASCVD Risk Reduction  Current lipid lowering medications: none Medications tried in the past: pravastatin  10 mg daily - aches  No history of rosuvastatin  PREVENT Risk Score: 10 year risk of CVD: 13.3% - 10 year risk of ASCVD: 7.7% - 10 year risk of HF: 8.0%  Objective:  Lab Results  Component Value Date   HGBA1C 6.7 (A) 03/10/2023   HGBA1C . 03/10/2023    Lab Results  Component Value Date   CREATININE 1.09 09/06/2022   BUN 23 09/06/2022   NA 141 09/06/2022   K 4.2 09/06/2022   CL 106 09/06/2022   CO2 27 09/06/2022    Lab Results  Component Value Date   CHOL 158 09/06/2022   HDL 37.10 (L) 09/06/2022   LDLCALC 85 09/06/2022   LDLDIRECT 96.0 09/07/2020   TRIG 181.0 (H) 09/06/2022   CHOLHDL 4 09/06/2022    Medications Reviewed Today     Reviewed by Ivan Mcguire, RPH-CPP (Pharmacist) on 03/24/23 at 1010  Med List Status: <None>   Medication Order Taking? Sig Documenting Provider Last Dose Status Informant  amitriptyline (ELAVIL) 25 MG tablet [id number] Yes TAKE 1 TABLET BY MOUTH EVERYDAY AT BEDTIME Ivan Nam, MD Taking Active   Dulaglutide (TRULICITY) 3 MG/0.5ML SOPN [id number] Yes INJECT 3 MG AS DIRECTED ONCE A WEEK. Ivan Nam, MD Taking Active   gabapentin (NEURONTIN) 300 MG capsule [id number] Yes 1-2 tabs up to 2 times a day if needed for pain Ivan Nam, MD Taking Active   ibuprofen (ADVIL) 200 MG tablet [id number] Yes Take 400 mg by mouth in the morning and at bedtime. [provider] Taking Active Self  lisinopril (ZESTRIL) 20 MG tablet [id number] Yes Take 1 tablet (20 mg total) by mouth daily. Ivan Nam, MD  Taking Active   metFORMIN (GLUCOPHAGE) 1000 MG tablet [id number] Yes Take 1 tablet (1,000 mg total) by mouth 2 (two) times daily with a meal. Ivan Nam, MD Taking Active   Multiple Vitamin (MULTIVITAMIN) tablet [id number] Yes Take 1 tablet by mouth daily. [provider] Taking Active Self  omeprazole (PRILOSEC) 20 MG capsule [id number] Yes Take 1 capsule (20 mg total) by mouth daily. Ivan Nam, MD Taking  Active               Assessment/Plan:   Diabetes: - Currently controlled but with access issues - Patient has not tried other formulary alternative, Victoza, but this medication is experiencing back order issues. Will complete PA for Ozempic noting that Victoza and Trulicity are not accessible options at this time. Unfortunately, Victoza is also being phased out by [employer], there is no longer a savings card that could be used in combination with patient's insurance to reduce copay. Mounjaro likely will not be covered/approved if Ozempic is not. If unable to access GLP1, could consider SGLT2 + savings card. Will follow PA outcome.   Hypertension: - Currently controlled - Recommend to continue current regimen   Hyperlipidemia/ASCVD Risk Reduction: - LDL currently controlled at goal <100, but last TG slightly elevated. PREVENT Risk score elevated, >7.5% 10 year risk of ASCVD. Recommend trial of statin with rosuvastatin 5 mg every other day to see if tolerated, with escalation to daily administration if tolerated.    Follow Up Plan: pending PA  Ivan Mcguire, PharmD, BCACP, CPP [facility] [phone]

## 2023-03-27 MED ORDER — SEMAGLUTIDE (1 MG/DOSE) 4 MG/3ML ~~LOC~~ SOPN: 1.0000 mg | PEN_INJECTOR | SUBCUTANEOUS | 1 refills | Status: DC

## 2023-04-03 ENCOUNTER — Telehealth: Payer: Self-pay | Admitting: Family Medicine

## 2023-04-03 NOTE — Telephone Encounter (Signed)
Patient is interested in attending one of the diabetic eye clinics. He would like to know if he is eligible and would like a phone call with the dates if so.

## 2023-04-04 NOTE — Telephone Encounter (Signed)
Noted. [facility] will reach out, I have notified them of his interested.

## 2023-04-06 ENCOUNTER — Other Ambulatory Visit: Payer: Self-pay | Admitting: Pharmacist

## 2023-04-06 MED ORDER — EMPAGLIFLOZIN 10 MG PO TABS: 10.0000 mg | ORAL_TABLET | Freq: Every day | ORAL | 2 refills | Status: DC

## 2023-04-06 NOTE — Progress Notes (Signed)
Care Coordination Call  Received message from patient that GLP1 is still too expensive, even with savings card. He notes that he checked and SGLT2 would be affordable. Discussed with PCP. Start Jardiance 10 mg daily. Order placed.   Follow up scheduled in 4 weeks.  Catie Eppie Gibson, PharmD, BCACP, CPP [facility] [phone]

## 2023-05-11 ENCOUNTER — Other Ambulatory Visit: Payer: BLUE CROSS/BLUE SHIELD | Admitting: Pharmacist

## 2023-05-11 LAB — HM DIABETES EYE EXAM

## 2023-05-11 NOTE — Progress Notes (Unsigned)
05/11/2023 Name: Ivan Mcguire MRN: [id number] DOB: 07-Apr-1961  Chief Complaint  Patient presents with   Medication Management   Diabetes    Cross Jorge is a 62 y.o. year old male who presented for a telephone visit.   They were referred to the pharmacist by their PCP for assistance in managing diabetes and medication access.    Subjective:  Care Team: Primary Care Provider: Joaquim Nam, MD ; Next Scheduled Visit: not scheduled  Medication Access/Adherence  Current Pharmacy:  [id number] - [place], [place] - [address] AT [facility] [address] [place] [address] Phone: [phone] Fax: [phone]  [facility] - [place], [place] - One [facility] AT Portal to Registered Caremark Sites One [place] [address] Phone: [phone] Fax: [phone]   Patient reports affordability concerns with their medications: Yes  Patient reports access/transportation concerns to their pharmacy: No  Patient reports adherence concerns with their medications:  No    High deductible plan. Is planning on looking at different options for January  Diabetes:  Current medications: Jardiance 10 mg daily, metformin 1000 mg twice daily Medications tried in the past: GLP1 too expensive due to high deductible plan  Current glucose readings: fastings and 2 hour post prandials in mid 200s    Objective:  Lab Results  Component Value Date   HGBA1C 6.7 (A) 03/10/2023   HGBA1C . 03/10/2023    Lab Results  Component Value Date   CREATININE 1.09 09/06/2022   BUN 23 09/06/2022   NA 141 09/06/2022   K 4.2 09/06/2022   CL 106 09/06/2022   CO2 27 09/06/2022    Lab Results  Component Value Date   CHOL 158 09/06/2022   HDL 37.10 (L) 09/06/2022   LDLCALC 85 09/06/2022   LDLDIRECT 96.0 09/07/2020   TRIG 181.0 (H) 09/06/2022   CHOLHDL 4 09/06/2022    Medications Reviewed Today     Reviewed by Alden Hipp, RPH-CPP (Pharmacist) on 05/11/23 at 1136  Med List Status: <None>   Medication Order Taking? Sig Documenting Provider Last Dose Status Informant  amitriptyline (ELAVIL) 25 MG tablet [id number]  TAKE 1 TABLET BY MOUTH EVERYDAY AT BEDTIME Joaquim Nam, MD  Active   empagliflozin (JARDIANCE) 10 MG TABS tablet [id number] Yes Take 1 tablet (10 mg total) by mouth daily before breakfast. Joaquim Nam, MD Taking Active   gabapentin (NEURONTIN) 300 MG capsule [id number]  1-2 tabs up to 2 times a day if needed for pain Joaquim Nam, MD  Active   ibuprofen (ADVIL) 200 MG tablet [id number]  Take 400 mg by mouth in the morning and at bedtime. [provider]  Active Self  lisinopril (ZESTRIL) 20 MG tablet [id number]  Take 1 tablet (20 mg total) by mouth daily. Joaquim Nam, MD  Active   metFORMIN (GLUCOPHAGE) 1000 MG tablet [id number] Yes Take 1 tablet (1,000 mg total) by mouth 2 (two) times daily with a meal. Joaquim Nam, MD Taking Active   Multiple Vitamin (MULTIVITAMIN) tablet [id number]  Take 1 tablet by mouth daily. [provider]  Active Self  omeprazole (PRILOSEC) 20 MG capsule [id number]  Take 1 capsule (20 mg total) by mouth daily. Joaquim Nam, MD  Active               Assessment/Plan:   Diabetes: - Currently uncontrolled and impacted by medication access - Reviewed long term cardiovascular and renal outcomes of uncontrolled blood  sugar - Reviewed goal A1c, goal fasting, and goal 2 hour post prandial glucose - Discussed remaining cost effective options of starting basal insulin vs sulfonylurea. Discussed risk of hypoglycemia, weight gain with either option. Patient elects to add sulfonylurea. Recommend to start glipizide 5 mg twice daily with breakfast and supper. Recommend to also maximize benefit from Jardiance by increasing to 25 mg daily. Will discuss with PCP.  - Recommend to check glucose twice daily, fasting and 2 hour post  prandial  Follow Up Plan: phone call in 6 weeks  Catie Eppie Gibson, PharmD, BCACP, CPP Clinical Pharmacist [facility] [phone]

## 2023-05-11 NOTE — Patient Instructions (Addendum)
Ivan Mcguire,   It was great talking to you today!  Increase Jardiance to 25 mg daily. Continue metformin. Start glipizide 5 mg twice daily.   Check your blood sugars twice daily:  1) Fasting, first thing in the morning before breakfast and  2) 2 hours after your largest meal.   For a goal A1c of less than 7%, goal fasting readings are less than 130 and goal 2 hour after meal readings are less than 180.    Take care!  Catie Eppie Gibson, PharmD, BCACP, CPP Clinical Pharmacist [facility] [phone]

## 2023-05-15 MED ORDER — GLIPIZIDE 5 MG PO TABS: 5.0000 mg | ORAL_TABLET | Freq: Two times a day (BID) | ORAL | 1 refills | Status: DC

## 2023-05-15 MED ORDER — EMPAGLIFLOZIN 25 MG PO TABS: 25.0000 mg | ORAL_TABLET | Freq: Every day | ORAL | 1 refills | Status: DC

## 2023-05-19 ENCOUNTER — Encounter: Payer: Self-pay | Admitting: Primary Care

## 2023-06-22 ENCOUNTER — Other Ambulatory Visit: Payer: BLUE CROSS/BLUE SHIELD | Admitting: Pharmacist

## 2023-06-22 NOTE — Progress Notes (Signed)
 I have reviewed the pharmacist's encounter and agree with their documentation.   Catie Eppie Gibson, PharmD, BCACP, CPP [facility] [phone]

## 2023-06-22 NOTE — Progress Notes (Signed)
06/22/2023 Name: Ivan Mcguire MRN: [id number] DOB: 07-Jun-1961  Chief Complaint  Patient presents with   Diabetes Mellitus    Ivan Mcguire is a 62 y.o. year old male who presented for a telephone visit.   They were referred to the pharmacist by their PCP for assistance in managing diabetes.   Subjective:  Care Team: Primary Care Provider: Joaquim Nam, MD ; Next Scheduled Visit: needs to scheduled   Medication Access/Adherence  Current Pharmacy:  [id number] [place], [place] - [address] AT [facility] [address] [place] [address] Phone: [phone] Fax: [phone]  [facility] - [place], [place] - One [facility] AT Portal to Registered Caremark Sites One [place] [address] Phone: [phone] Fax: [phone]   Patient reports affordability concerns with their medications: Yes  - high deductible plan, unable to afford GLP-1. Jardiance is $143/month- he needs to make sure the pharmacy is using a coupon card. Patient reports access/transportation concerns to their pharmacy: No  Patient reports adherence concerns with their medications:  No    Diabetes:  Has been sick for 3 weeks, has had a cough, not eating well. BG have been slightly higher while he has been sick but he still overall feels better since starting the glipizide.   Current medications: empagliflozin 25 mg daily, glipizide 5 mg daily, metformin 1000 mg BID Medications tried in the past: dulaglutide ($), semaglutide/Ozempic ($)- also reports that he felt nauseous all the time on Ozempic 1 mg, so he feels better since stopping it from that perspective.  Using glucose meter; testing 1 time daily Current glucose readings: FBG 156. Since starting the glipizide denies BG over 200.    Patient denies hypoglycemic s/sx including dizziness, shakiness, sweating. Patient denies hyperglycemic symptoms including polyuria, polydipsia,  polyphagia, nocturia, neuropathy, blurred vision.  Current meal patterns:  - Drinks: 8 glasses of water/day. Has been staying hydrated while sick.  Current medication access support: none  Objective:  Lab Results  Component Value Date   HGBA1C 6.7 (A) 03/10/2023   HGBA1C . 03/10/2023    Lab Results  Component Value Date   CREATININE 1.09 09/06/2022   BUN 23 09/06/2022   NA 141 09/06/2022   K 4.2 09/06/2022   CL 106 09/06/2022   CO2 27 09/06/2022    Lab Results  Component Value Date   CHOL 158 09/06/2022   HDL 37.10 (L) 09/06/2022   LDLCALC 85 09/06/2022   LDLDIRECT 96.0 09/07/2020   TRIG 181.0 (H) 09/06/2022   CHOLHDL 4 09/06/2022    Medications Reviewed Today     Reviewed by Particia Lather, RPH (Pharmacist) on 06/22/23 at 1144  Med List Status: <None>   Medication Order Taking? Sig Documenting Provider Last Dose Status Informant  amitriptyline (ELAVIL) 25 MG tablet [id number] Yes TAKE 1 TABLET BY MOUTH EVERYDAY AT BEDTIME Joaquim Nam, MD Taking Active   empagliflozin (JARDIANCE) 25 MG TABS tablet [id number] Yes Take 1 tablet (25 mg total) by mouth daily before breakfast. Joaquim Nam, MD Taking Active   gabapentin (NEURONTIN) 300 MG capsule [id number] Yes 1-2 tabs up to 2 times a day if needed for pain Joaquim Nam, MD Taking Active   glipiZIDE (GLUCOTROL) 5 MG tablet [id number] Yes Take 1 tablet (5 mg total) by mouth 2 (two) times daily before a meal. Joaquim Nam, MD Taking Active   ibuprofen (ADVIL) 200 MG tablet [id number]  Take 400 mg by mouth in  the morning and at bedtime. [provider]  Active Self  lisinopril (ZESTRIL) 20 MG tablet [id number] Yes Take 1 tablet (20 mg total) by mouth daily. Joaquim Nam, MD Taking Active   metFORMIN (GLUCOPHAGE) 1000 MG tablet [id number] Yes Take 1 tablet (1,000 mg total) by mouth 2 (two) times daily with a meal. Joaquim Nam, MD Taking Active   Multiple Vitamin (MULTIVITAMIN) tablet [id number]  Take 1  tablet by mouth daily. [provider]  Active Self  omeprazole (PRILOSEC) 20 MG capsule [id number] Yes Take 1 capsule (20 mg total) by mouth daily. Joaquim Nam, MD Taking Active              Assessment/Plan:   Diabetes: - Currently uncontrolled per FBG above goal 80-130 mg/dL, though improved since starting glipizide. Pt is not having overt symptoms of hyperglycemia, though encouraged patient to have A1c checked since stopping semaglutide so we can assess need for insulin until he is able to change his insurance plan and resume GLP-1 RA. Pt is likely having PPG >200 mg/dL, though he is not currently checking.  - Reviewed long term cardiovascular and renal outcomes of uncontrolled blood sugar - Reviewed goal A1c, goal fasting, and goal 2 hour post prandial glucose - Reviewed dietary modifications including staying hydrated throughout the day with water, especially while being sick and taking empagliflozin (Jardiance) - Recommend to continue current regimen: metformin 1000 mg BID, glipizide 5 mg BID, empagliflozin 25 mg daily - Recommend for patient to make PCP appointment as soon as possible to have A1c checked. - Pt elected to defer pharmacy f/u as he does not anticipate his insurance changing until the new year. Encouraged him to reach out if he begins to experience highly elevated blood sugars or feels symptoms of high blood sugars as we can discuss starting insulin.   - Recommend to check glucose once daily fasting, occasionally 2 hour PPG  Follow Up Plan: Patient to schedule PCP appt  Nils Pyle, PharmD PGY1 Pharmacy Resident

## 2023-06-23 ENCOUNTER — Telehealth: Payer: 59 | Admitting: Family Medicine

## 2023-06-23 DIAGNOSIS — B9689 Other specified bacterial agents as the cause of diseases classified elsewhere: Secondary | ICD-10-CM

## 2023-06-23 DIAGNOSIS — J40 Bronchitis, not specified as acute or chronic: Secondary | ICD-10-CM | POA: Diagnosis not present

## 2023-06-23 DIAGNOSIS — J019 Acute sinusitis, unspecified: Secondary | ICD-10-CM

## 2023-06-23 MED ORDER — DOXYCYCLINE HYCLATE 100 MG PO TABS: 100.0000 mg | ORAL_TABLET | Freq: Two times a day (BID) | ORAL | 0 refills | Status: AC

## 2023-06-23 MED ORDER — PROMETHAZINE-DM 6.25-15 MG/5ML PO SYRP: 5.0000 mL | ORAL_SOLUTION | Freq: Four times a day (QID) | ORAL | 0 refills | Status: AC | PRN

## 2023-06-23 NOTE — Patient Instructions (Signed)

## 2023-06-23 NOTE — Progress Notes (Signed)
Virtual Visit Consent   Ivan Mcguire, you are scheduled for a virtual visit with a [facility] provider today. Just as with appointments in the office, your consent must be obtained to participate. Your consent will be active for this visit and any virtual visit you may have with one of our providers in the next 365 days. If you have a MyChart account, a copy of this consent can be sent to you electronically.  As this is a virtual visit, video technology does not allow for your provider to perform a traditional examination. This may limit your provider's ability to fully assess your condition. If your provider identifies any concerns that need to be evaluated in person or the need to arrange testing (such as labs, EKG, etc.), we will make arrangements to do so. Although advances in technology are sophisticated, we cannot ensure that it will always work on either your end or our end. If the connection with a video visit is poor, the visit may have to be switched to a telephone visit. With either a video or telephone visit, we are not always able to ensure that we have a secure connection.  By engaging in this virtual visit, you consent to the provision of healthcare and authorize for your insurance to be billed (if applicable) for the services provided during this visit. Depending on your insurance coverage, you may receive a charge related to this service.  I need to obtain your verbal consent now. Are you willing to proceed with your visit today? Ivan Mcguire has provided verbal consent on 06/23/2023 for a virtual visit (video or telephone). Georgana Curio, FNP  Date: 06/23/2023 11:17 AM  Virtual Visit via Video Note   I, Georgana Curio, connected with  Ivan Mcguire  ([id number], 1960/11/13) on 06/23/23 at 11:15 AM EDT by a video-enabled telemedicine application and verified that I am speaking with the correct person using two identifiers.  Location: Patient: Virtual Visit Location Patient:  Home Provider: Virtual Visit Location Provider: Home Office   I discussed the limitations of evaluation and management by telemedicine and the availability of in person appointments. The patient expressed understanding and agreed to proceed.    History of Present Illness: Ivan Mcguire is a 62 y.o. who identifies as a male who was assigned male at birth, and is being seen today for head congestion, sinus pressure, cough, no fever. Yellow and green mucus. Sx for a month worsening .  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   Osteoarthritis of right hip 03/26/2013   OSA on CPAP 04/04/2011   Osteoporosis 04/04/2011   Fatty liver 04/04/2011   Gynecomastia 03/24/2011    Allergies:  Allergies  Allergen Reactions   Hydrocodone Itching   Pravastatin     Aches on 10mg  daily   Medications:  Current Outpatient Medications:    amitriptyline (ELAVIL) 25 MG tablet, TAKE 1 TABLET BY MOUTH EVERYDAY AT BEDTIME, Disp: 90 tablet, Rfl: 2   empagliflozin (JARDIANCE) 25 MG TABS tablet, Take  1 tablet (25 mg total) by mouth daily before breakfast., Disp: 90 tablet, Rfl: 1   gabapentin (NEURONTIN) 300 MG capsule, 1-2 tabs up to 2 times a day if needed for pain, Disp: 360 capsule, Rfl: 3   glipiZIDE (GLUCOTROL) 5 MG tablet, Take 1 tablet (5 mg total) by mouth 2 (two) times daily before a meal., Disp: 180 tablet, Rfl: 1   ibuprofen (ADVIL) 200 MG tablet, Take 400 mg by mouth in  the morning and at bedtime., Disp: , Rfl:    lisinopril (ZESTRIL) 20 MG tablet, Take 1 tablet (20 mg total) by mouth daily., Disp: 90 tablet, Rfl: 3   metFORMIN (GLUCOPHAGE) 1000 MG tablet, Take 1 tablet (1,000 mg total) by mouth 2 (two) times daily with a meal., Disp: 180 tablet, Rfl: 3   Multiple Vitamin (MULTIVITAMIN) tablet, Take 1 tablet by mouth daily., Disp: , Rfl:    omeprazole (PRILOSEC) 20 MG capsule, Take 1 capsule (20 mg total) by mouth daily., Disp: , Rfl:   Observations/Objective: Patient is well-developed, well-nourished in no acute distress.  Resting comfortably at home.  Head is normocephalic, atraumatic.  No labored breathing.  Speech is clear and coherent with logical content.  Patient is alert and oriented at baseline.    Assessment and Plan: 1. Bronchitis  2. Acute bacterial sinusitis  Increase fluids, humidifier at night, mucinex, tylenol, rtc as needed.   Follow Up Instructions: I discussed the assessment and treatment plan with the patient. The patient was provided an opportunity to ask questions and all were answered. The patient agreed with the plan and demonstrated an understanding of the instructions.  A copy of instructions were sent to the patient via MyChart unless otherwise noted below.     The patient was advised to call back or seek an in-person evaluation if the symptoms worsen or if the condition fails to improve as anticipated.  Time:  I spent 10 minutes with the patient via telehealth technology discussing the above problems/concerns.    Georgana Curio, FNP

## 2023-09-27 ENCOUNTER — Other Ambulatory Visit: Payer: Self-pay | Admitting: Family Medicine

## 2023-09-27 DIAGNOSIS — I10 Essential (primary) hypertension: Secondary | ICD-10-CM

## 2023-09-27 NOTE — Telephone Encounter (Signed)
Last office visit: 03/10/23 Next office visit: nothing scheduled Last refill: 09/12/22 gabapentin (NEURONTIN) 300 MG capsule 360 capsules 3 refills

## 2023-10-02 DIAGNOSIS — M978XXA Periprosthetic fracture around other internal prosthetic joint, initial encounter: Secondary | ICD-10-CM | POA: Diagnosis not present

## 2023-10-02 DIAGNOSIS — S46011A Strain of muscle(s) and tendon(s) of the rotator cuff of right shoulder, initial encounter: Secondary | ICD-10-CM | POA: Diagnosis not present

## 2023-10-05 DIAGNOSIS — M978XXD Periprosthetic fracture around other internal prosthetic joint, subsequent encounter: Secondary | ICD-10-CM | POA: Diagnosis not present

## 2023-10-19 DIAGNOSIS — M25561 Pain in right knee: Secondary | ICD-10-CM | POA: Diagnosis not present

## 2023-10-19 DIAGNOSIS — M25511 Pain in right shoulder: Secondary | ICD-10-CM | POA: Diagnosis not present

## 2023-10-19 DIAGNOSIS — M25551 Pain in right hip: Secondary | ICD-10-CM | POA: Diagnosis not present

## 2023-10-22 ENCOUNTER — Other Ambulatory Visit: Payer: Self-pay | Admitting: Family Medicine

## 2023-10-23 ENCOUNTER — Telehealth: Payer: Self-pay | Admitting: *Deleted

## 2023-10-23 NOTE — Telephone Encounter (Signed)
Looks like front office was calling to schedule CPE before refilling pt's amaitriptyline, ? If pt still needs appt given his response in prev message. Please advise

## 2023-10-23 NOTE — Telephone Encounter (Signed)
Copied from CRM [id number]. Topic: Clinical - Medication Question >> Oct 23, 2023  3:35 PM Chantha C wrote: Reason for CRM: Pt toke a fall three weeks ago, fracture femur R side and torn rotary cuff, pt is immoblie at the moment. Pt's schedule is unknown because he may need to have surgery. Pt is returning a call from the office, pt is unsure of if the office is calling about an [facility]. Pls advise and c/b [phone].  [id number] [place], [place] - [address] AT [facility] [address] [place] [address] [phone]

## 2023-10-23 NOTE — Telephone Encounter (Signed)
See phone note

## 2023-10-23 NOTE — Telephone Encounter (Signed)
LVM for patient to c/b and schedule.  

## 2023-10-23 NOTE — Telephone Encounter (Signed)
Patient is due for CPE please call patient to set up send back once done so we can send in refill.

## 2023-10-24 NOTE — Telephone Encounter (Signed)
lvmtcb

## 2023-10-24 NOTE — Telephone Encounter (Signed)
 Rx sent.  Please see other refill request note about scheduling when possible.

## 2023-10-24 NOTE — Telephone Encounter (Signed)
Reported h/o femur fracture and rotator cuff tear.  Please schedule when possible- I realized it may not be in the near future.  I hope he feels better.  Please request notes about his injury.  I sent the rx.

## 2023-10-26 ENCOUNTER — Telehealth: Payer: Self-pay | Admitting: *Deleted

## 2023-10-26 ENCOUNTER — Other Ambulatory Visit: Payer: Self-pay | Admitting: Family Medicine

## 2023-10-26 NOTE — Telephone Encounter (Signed)
 Patient was due for CPE in November 2024; please call patient to schedule CPE.

## 2023-10-26 NOTE — Telephone Encounter (Signed)
 Lvmtcb, sent mychart message

## 2023-10-26 NOTE — Telephone Encounter (Signed)
 Please let me know if he needs any refills in the meantime.  I understand that he has limited mobility.  I will be glad to see him whenever possible and I hope he feels better soon.  Thanks.

## 2023-10-26 NOTE — Telephone Encounter (Signed)
 FYI from refill request asking to schedule appt when possible.

## 2023-10-26 NOTE — Telephone Encounter (Signed)
 Called patient left vm to see if he needs any additional medication sent aside from the one's that were sent today.

## 2023-10-26 NOTE — Telephone Encounter (Signed)
 Copied from CRM [id number]. Topic: General - Other >> Oct 26, 2023 10:13 AM Isabell A wrote: Reason for CRM: Patient states he has a fractured hip, torn rotator cuff & he is currently not mobile to come in for any refills.

## 2023-12-17 ENCOUNTER — Telehealth: Payer: No Typology Code available for payment source

## 2023-12-17 DIAGNOSIS — U071 COVID-19: Secondary | ICD-10-CM | POA: Diagnosis not present

## 2023-12-17 MED ORDER — NIRMATRELVIR/RITONAVIR (PAXLOVID)TABLET: 3.0000 | ORAL_TABLET | Freq: Two times a day (BID) | ORAL | 0 refills | Status: AC

## 2023-12-17 MED ORDER — BENZONATATE 200 MG PO CAPS: 200.0000 mg | ORAL_CAPSULE | Freq: Two times a day (BID) | ORAL | 0 refills | Status: DC | PRN

## 2023-12-17 NOTE — Progress Notes (Signed)
 Virtual Visit Consent   Ivan Mcguire, you are scheduled for a virtual visit with a [facility] provider today. Just as with appointments in the office, your consent must be obtained to participate. Your consent will be active for this visit and any virtual visit you may have with one of our providers in the next 365 days. If you have a MyChart account, a copy of this consent can be sent to you electronically.  As this is a virtual visit, video technology does not allow for your provider to perform a traditional examination. This may limit your provider's ability to fully assess your condition. If your provider identifies any concerns that need to be evaluated in person or the need to arrange testing (such as labs, EKG, etc.), we will make arrangements to do so. Although advances in technology are sophisticated, we cannot ensure that it will always work on either your end or our end. If the connection with a video visit is poor, the visit may have to be switched to a telephone visit. With either a video or telephone visit, we are not always able to ensure that we have a secure connection.  By engaging in this virtual visit, you consent to the provision of healthcare and authorize for your insurance to be billed (if applicable) for the services provided during this visit. Depending on your insurance coverage, you may receive a charge related to this service.  I need to obtain your verbal consent now. Are you willing to proceed with your visit today? Ivan Mcguire has provided verbal consent on 12/17/2023 for a virtual visit (video or telephone). Ivan Rodney, FNP  Date: 12/17/2023 8:32 AM   Virtual Visit via Video Note   I, Ivan Mcguire, connected with  Ivan Mcguire  ([id number], [birth year]) on 12/17/23 at  8:30 AM EST by a video-enabled telemedicine application and verified that I am speaking with the correct person using two identifiers.  Location: Patient: Virtual Visit Location Patient:  Home Provider: Virtual Visit Location Provider: Home Office   I discussed the limitations of evaluation and management by telemedicine and the availability of in person appointments. The patient expressed understanding and agreed to proceed.    History of Present Illness: Ivan Mcguire is a 63 y.o. who identifies as a male who was assigned male at birth, and is being seen today for COVID. He reports his symptoms started yesterday and tested positive last night.   HPI: URI  This is a new problem. The current episode started yesterday. The problem has been unchanged. Associated symptoms include congestion, coughing, headaches, joint pain, rhinorrhea, sneezing and a sore throat. Pertinent negatives include no abdominal pain, ear pain or sinus pain. He has tried acetaminophen for the symptoms. The treatment provided mild relief.    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 (Take nirmatrelvir 150 mg two tablets twice daily for 5 days and ritonavir 100 mg one tablet twice daily for 5 days) Patient GFR is 74, Disp: 30 tablet, Rfl: 0   amitriptyline (ELAVIL) 25 MG tablet, TAKE 1 TABLET BY MOUTH EVERYDAY AT BEDTIME, Disp: 90 tablet, Rfl: 1   gabapentin (NEURONTIN) 300 MG capsule, 1-2 TABS UP TO 2 TIMES A DAY IF NEEDED FOR PAIN, Disp: 120 capsule, Rfl: 2   glipiZIDE (GLUCOTROL) 5 MG tablet, TAKE 1 TABLET (5 MG TOTAL) BY MOUTH TWICE A DAY BEFORE MEALS, Disp: 60 tablet, Rfl: 2   ibuprofen (ADVIL) 200 MG tablet, Take 400 mg by mouth in the morning and at bedtime., Disp: , Rfl:    JARDIANCE 25 MG TABS tablet, TAKE 1 TABLET BY MOUTH DAILY BEFORE BREAKFAST., Disp: 30 tablet, Rfl: 2   lisinopril (ZESTRIL) 20 MG tablet, TAKE 1 TABLET BY MOUTH EVERY DAY, Disp: 30 tablet, Rfl: 3   metFORMIN (GLUCOPHAGE) 1000 MG tablet, TAKE 1 TABLET (1,000 MG TOTAL) BY MOUTH TWICE A DAY WITH FOOD, Disp: 60 tablet, Rfl: 3   Multiple Vitamin (MULTIVITAMIN) tablet, Take 1 tablet by mouth daily., Disp: , Rfl:    omeprazole (PRILOSEC) 20 MG capsule, Take 1 capsule (20 mg total) by mouth daily., Disp: , Rfl:   Observations/Objective: Patient is well-developed, well-nourished in no acute distress.  Resting comfortably  at home.  Head is normocephalic, atraumatic.  No labored breathing.  Speech is clear and coherent with logical content.  Patient is alert and oriented at baseline.  Dry nonproductive cough  Assessment and Plan: 1. COVID-19 (Primary) - nirmatrelvir/ritonavir (PAXLOVID) 20 x 150 MG & 10 x 100MG  TABS; Take 3 tablets by mouth 2 (two) times daily for 5 days. (Take nirmatrelvir 150 mg two tablets twice daily for 5 days and ritonavir 100 mg one tablet twice daily for 5 days) Patient GFR is 74  Dispense: 30 tablet; Refill: 0 - benzonatate (TESSALON) 200 MG capsule; Take 1 capsule (200 mg total) by mouth 2 (two) times daily as needed  for cough.  Dispense: 20 capsule; Refill: 0  - Take meds as prescribed - Use a cool mist humidifier  -Use saline nose sprays frequently -Force fluids -For any cough or congestion  Use plain Mucinex- regular strength or max strength is fine -For fever or aces or pains- take tylenol or ibuprofen. -Throat lozenges if help -Follow up if symptoms worsen or do not improve   Follow Up Instructions: I discussed the assessment and treatment plan with the patient. The patient was provided an opportunity to ask questions and all were answered. The patient agreed with the plan and demonstrated an understanding of the instructions.  A copy of instructions were sent to the patient via MyChart unless otherwise noted below.     The patient was advised to call back or seek an in-person evaluation if the symptoms worsen or if the condition fails to improve as anticipated.    Ivan Rodney, FNP

## 2023-12-26 ENCOUNTER — Encounter: Payer: Self-pay | Admitting: Family Medicine

## 2023-12-26 ENCOUNTER — Ambulatory Visit (INDEPENDENT_AMBULATORY_CARE_PROVIDER_SITE_OTHER): Payer: No Typology Code available for payment source | Admitting: Family Medicine

## 2023-12-26 VITALS — BP 124/62 | HR 85 | Temp 97.8°F | Ht 66.69 in | Wt 238.4 lb

## 2023-12-26 DIAGNOSIS — G720 Drug-induced myopathy: Secondary | ICD-10-CM

## 2023-12-26 DIAGNOSIS — Z7985 Long-term (current) use of injectable non-insulin antidiabetic drugs: Secondary | ICD-10-CM | POA: Diagnosis not present

## 2023-12-26 DIAGNOSIS — E1149 Type 2 diabetes mellitus with other diabetic neurological complication: Secondary | ICD-10-CM

## 2023-12-26 DIAGNOSIS — I10 Essential (primary) hypertension: Secondary | ICD-10-CM

## 2023-12-26 DIAGNOSIS — Z0000 Encounter for general adult medical examination without abnormal findings: Secondary | ICD-10-CM

## 2023-12-26 DIAGNOSIS — Z7189 Other specified counseling: Secondary | ICD-10-CM

## 2023-12-26 LAB — CBC WITH DIFFERENTIAL/PLATELET
Basophils Absolute: 0 10*3/uL (ref 0.0–0.1)
Basophils Relative: 0.6 % (ref 0.0–3.0)
Eosinophils Absolute: 0.1 10*3/uL (ref 0.0–0.7)
Eosinophils Relative: 1.1 % (ref 0.0–5.0)
HCT: 43.3 % (ref 39.0–52.0)
Hemoglobin: 14.2 g/dL (ref 13.0–17.0)
Lymphocytes Relative: 18.7 % (ref 12.0–46.0)
Lymphs Abs: 1 10*3/uL (ref 0.7–4.0)
MCHC: 32.7 g/dL (ref 30.0–36.0)
MCV: 88.5 fl (ref 78.0–100.0)
Monocytes Absolute: 0.4 10*3/uL (ref 0.1–1.0)
Monocytes Relative: 7.4 % (ref 3.0–12.0)
Neutro Abs: 3.7 10*3/uL (ref 1.4–7.7)
Neutrophils Relative %: 72.2 % (ref 43.0–77.0)
Platelets: 125 10*3/uL — ABNORMAL LOW (ref 150.0–400.0)
RBC: 4.89 Mil/uL (ref 4.22–5.81)
RDW: 14.5 % (ref 11.5–15.5)
WBC: 5.2 10*3/uL (ref 4.0–10.5)

## 2023-12-26 LAB — LIPID PANEL
Cholesterol: 191 mg/dL (ref 0–200)
HDL: 38.5 mg/dL — ABNORMAL LOW (ref >39.00)
LDL Cholesterol: 88 mg/dL (ref 0–99)
NonHDL: 152.66
Total CHOL/HDL Ratio: 5
Triglycerides: 323 mg/dL — ABNORMAL HIGH (ref 0.0–149.0)
VLDL: 64.6 mg/dL — ABNORMAL HIGH (ref 0.0–40.0)

## 2023-12-26 LAB — COMPREHENSIVE METABOLIC PANEL
ALT: 39 U/L (ref 0–53)
AST: 46 U/L — ABNORMAL HIGH (ref 0–37)
Albumin: 4.3 g/dL (ref 3.5–5.2)
Alkaline Phosphatase: 123 U/L — ABNORMAL HIGH (ref 39–117)
BUN: 22 mg/dL (ref 6–23)
CO2: 26 meq/L (ref 19–32)
Calcium: 9.6 mg/dL (ref 8.4–10.5)
Chloride: 100 meq/L (ref 96–112)
Creatinine, Ser: 1 mg/dL (ref 0.40–1.50)
GFR: 79.72 mL/min (ref >60.00)
Glucose, Bld: 185 mg/dL — ABNORMAL HIGH (ref 70–99)
Potassium: 4.7 meq/L (ref 3.5–5.1)
Sodium: 135 meq/L (ref 135–145)
Total Bilirubin: 0.5 mg/dL (ref 0.2–1.2)
Total Protein: 8 g/dL (ref 6.0–8.3)

## 2023-12-26 LAB — MICROALBUMIN / CREATININE URINE RATIO
Creatinine,U: 55.5 mg/dL
Microalb Creat Ratio: 35.8 mg/g — ABNORMAL HIGH (ref 0.0–30.0)
Microalb, Ur: 2 mg/dL — ABNORMAL HIGH (ref 0.0–1.9)

## 2023-12-26 LAB — HEMOGLOBIN A1C: Hgb A1c MFr Bld: 8.1 % — ABNORMAL HIGH (ref 4.6–6.5)

## 2023-12-26 NOTE — Patient Instructions (Signed)
 Go to the lab on the way out.   If you have mychart we'll likely use that to update you.    Take care.  Glad to see you. Ask the front for a record release for the last year from [facility].

## 2023-12-26 NOTE — Progress Notes (Unsigned)
 CPE- See plan.  Routine anticipatory guidance given to patient.  See health maintenance.  The possibility exists that previously documented standard health maintenance information may have been brought forward from a previous encounter into this note.  If needed, that same information has been updated to reflect the current situation based on today's encounter.    Tetanus 2018 PNA 2017 Shingles prev done.   Flu shot 2025 Covid prev done.  Colonoscopy 2022 Prostate cancer screening and PSA options (with potential risks and benefits of testing vs not testing) were discussed along with recent recs/guidelines.  He declined testing PSA at this point. Living will d/w pt.  Wife designated if patient were incapacitated.    He had multiple events, dental surgery, then hip pain.  No hip pain now but he has muscle deconditioning on the R leg.   Diabetes:  Using medications without difficulties: yes Hypoglycemic episodes:no Hyperglycemic episodes: ~200s.   Feet problems: at baseline.  Altered sensation.   Blood Sugars averaging: ~200.   eye exam within last year: yes He is off ozempic.  He was asking about restart.   Exercise affected by recent injuries.   Statin intolerant.   Hypertension:    Using medication without problems or lightheadedness: yes Chest pain with exertion:no Edema:no Short of breath:no Labs pending.   He is trying to put off R shoulder surgery.  Discussed.    PMH and SH reviewed  Meds, vitals, and allergies reviewed.   ROS: Per HPI.  Unless specifically indicated otherwise in HPI, the patient denies:  General: fever. Eyes: acute vision changes ENT: sore throat Cardiovascular: chest pain Respiratory: SOB GI: vomiting GU: dysuria Musculoskeletal: acute back pain Derm: acute rash Neuro: acute motor dysfunction Psych: worsening mood Endocrine: polydipsia Heme: bleeding Allergy: hayfever  GEN: nad, alert and oriented HEENT: ncat NECK: supple w/o LA CV:  rrr. PULM: ctab, no inc wob ABD: soft, +bs EXT: no edema SKIN: no acute rash  Diabetic foot exam: Normal inspection No skin breakdown No calluses  Normal DP pulses Normal sensation to light touch but mild dec to monofilament Nails normal  Labs pending

## 2023-12-27 ENCOUNTER — Encounter: Payer: Self-pay | Admitting: Family Medicine

## 2023-12-27 ENCOUNTER — Other Ambulatory Visit: Payer: Self-pay | Admitting: Family Medicine

## 2023-12-27 DIAGNOSIS — E1149 Type 2 diabetes mellitus with other diabetic neurological complication: Secondary | ICD-10-CM

## 2023-12-27 MED ORDER — OZEMPIC (0.25 OR 0.5 MG/DOSE) 2 MG/3ML ~~LOC~~ SOPN: 0.2500 mg | PEN_INJECTOR | SUBCUTANEOUS | 3 refills | Status: DC

## 2023-12-27 NOTE — Assessment & Plan Note (Signed)
 Statin intolerant

## 2023-12-27 NOTE — Assessment & Plan Note (Signed)
 Tetanus 2018 PNA 2017 Shingles prev done.   Flu shot 2025 Covid prev done.  Colonoscopy 2022 Prostate cancer screening and PSA options (with potential risks and benefits of testing vs not testing) were discussed along with recent recs/guidelines.  He declined testing PSA at this point. Living will d/w pt.  Wife designated if patient were incapacitated.

## 2023-12-27 NOTE — Assessment & Plan Note (Signed)
 Living will d/w pt.  Wife designated if patient were incapacitated.   ?

## 2023-12-27 NOTE — Assessment & Plan Note (Signed)
 He is off ozempic.  He was asking about restart.   Exercise affected by recent injuries.   Statin intolerant.  See notes on labs.

## 2023-12-27 NOTE — Assessment & Plan Note (Addendum)
 Continue lisinopril, see notes on labs.

## 2024-01-16 ENCOUNTER — Other Ambulatory Visit: Payer: Self-pay | Admitting: Family Medicine

## 2024-01-20 ENCOUNTER — Other Ambulatory Visit: Payer: Self-pay | Admitting: Family Medicine

## 2024-01-20 DIAGNOSIS — I10 Essential (primary) hypertension: Secondary | ICD-10-CM

## 2024-01-23 ENCOUNTER — Other Ambulatory Visit: Payer: Self-pay | Admitting: Family Medicine

## 2024-03-13 ENCOUNTER — Telehealth: Payer: Self-pay | Admitting: Family Medicine

## 2024-03-13 NOTE — Telephone Encounter (Signed)
 Placed in your tray

## 2024-03-13 NOTE — Telephone Encounter (Signed)
 I will work on [employer].  Thanks.

## 2024-03-13 NOTE — Telephone Encounter (Signed)
 Type of form received: [employer] manager  Additional comments:  NA  Received by:  Haskell Linker  Form should be Faxed to: NA  Form should be mailed to:  NA  Is patient requesting call for pickup: YES   Form placed:  Provider folder  Attach charge sheet. Yes  Individual made aware of 3-5 business day turn around (Y/N)?  Yes

## 2024-03-22 ENCOUNTER — Telehealth: Payer: Self-pay

## 2024-03-22 ENCOUNTER — Other Ambulatory Visit (HOSPITAL_COMMUNITY): Payer: Self-pay

## 2024-03-22 NOTE — Telephone Encounter (Signed)
 Patient insurance is requiring a prior authorization for Semaglutide ,0.25 or 0.5MG /DOS, (OZEMPIC , 0.25 OR 0.5 MG/DOSE,) 2 MG/3ML SOPN  as of 04/23/24. Is this something that can be started now. It shows that he has Production designer, [employer]tele[employer] for his insurance.   Thank you

## 2024-03-22 NOTE — Telephone Encounter (Signed)
 Closing encounter prior authorization pending

## 2024-03-22 NOTE — Telephone Encounter (Signed)
 Pharmacy Patient Advocate Encounter   Received notification from Pt Calls Messages that prior authorization for Ozempic  2 is required/requested.   Insurance verification completed.   The patient is insured through [facility] .   Per test claim: PA required; PA submitted to above mentioned insurance via CoverMyMeds Key/confirmation #/EOC [id number] Status is pending

## 2024-03-25 ENCOUNTER — Other Ambulatory Visit (HOSPITAL_COMMUNITY): Payer: Self-pay

## 2024-03-25 NOTE — Telephone Encounter (Signed)
 Pharmacy Patient Advocate Encounter  Received notification from [facility] that Prior Authorization for Ozempic  2 has been APPROVED from 03/22/24 to 03/22/25. Unable to obtain price due to refill too soon rejection, last fill date 03/15/24 next available fill date 04/05/24   PA #/Case ID/Reference #: [id number]

## 2024-03-26 NOTE — Telephone Encounter (Signed)
 Left voicemail for patient to return call to office.

## 2024-03-29 NOTE — Telephone Encounter (Signed)
 Left voicemail for patient to return call to office.

## 2024-04-01 NOTE — Telephone Encounter (Signed)
 Patient notified

## 2024-04-14 ENCOUNTER — Other Ambulatory Visit: Payer: Self-pay | Admitting: Family Medicine

## 2024-04-17 ENCOUNTER — Other Ambulatory Visit: Payer: Self-pay | Admitting: Family Medicine

## 2024-04-17 DIAGNOSIS — I10 Essential (primary) hypertension: Secondary | ICD-10-CM

## 2024-04-19 ENCOUNTER — Other Ambulatory Visit: Payer: Self-pay | Admitting: Family Medicine

## 2024-04-21 ENCOUNTER — Other Ambulatory Visit: Payer: Self-pay | Admitting: Family Medicine

## 2024-07-14 ENCOUNTER — Other Ambulatory Visit: Payer: Self-pay | Admitting: Family Medicine

## 2024-07-14 DIAGNOSIS — E1149 Type 2 diabetes mellitus with other diabetic neurological complication: Secondary | ICD-10-CM

## 2024-08-14 ENCOUNTER — Other Ambulatory Visit: Payer: Self-pay | Admitting: Family Medicine

## 2024-08-15 NOTE — Telephone Encounter (Signed)
 Ozempic  Last filled:  07/19/24, #3 mL Last OV:  12/26/23, CPE Next OV:  none

## 2024-08-16 ENCOUNTER — Other Ambulatory Visit: Payer: Self-pay | Admitting: Family Medicine

## 2024-08-16 NOTE — Telephone Encounter (Signed)
 Copied from CRM [id number]. Topic: Clinical - Medication Refill >> Aug 16, 2024 12:12 PM Sophia H wrote: Medication: Semaglutide ,0.25 or 0.5MG /DOS, (OZEMPIC , 0.25 OR 0.5 MG/DOSE,) 2 MG/3ML SOPN  Has the patient contacted their pharmacy? Yes, pharmacy stated they reached out with no response.   This is the patient's preferred pharmacy:  [id number] - [place], [place] - [address] AT [facility] [address] [place] [address] Phone: [phone] Fax: [phone]  Is this the correct pharmacy for this prescription? Yes If no, delete pharmacy and type the correct one.   Has the prescription been filled recently? Yes  Is the patient out of the medication? Yes  Has the patient been seen for an appointment in the last year OR does the patient have an upcoming appointment? Yes, last OV 12/2023.  Can we respond through MyChart? Yes  Agent: Please be advised that Rx refills may take up to 3 business days. We ask that you follow-up with your pharmacy.

## 2024-08-16 NOTE — Telephone Encounter (Signed)
 Sent. Thanks.

## 2024-10-07 ENCOUNTER — Telehealth: Payer: Self-pay

## 2024-10-07 ENCOUNTER — Other Ambulatory Visit (HOSPITAL_COMMUNITY): Payer: Self-pay

## 2024-10-07 NOTE — Telephone Encounter (Unsigned)
 Copied from CRM #[id number]. Topic: Clinical - Medication Prior Auth >> Oct 07, 2024  1:14 PM Antony RAMAN wrote: Reason for CRM: hannah from [employer] approval for ozempic  for one year 10/07/24-10/07/25  [id number] option 5

## 2024-10-07 NOTE — Telephone Encounter (Signed)
 Pharmacy Patient Advocate Encounter   Received notification from Onbase that prior authorization for Ozempic  2 is required/requested.   Insurance verification completed.   The patient is insured through [facility].   Per test claim: PA required; PA submitted to above mentioned insurance via Latent Key/confirmation #/EOC [id number] Status is pending

## 2024-10-08 ENCOUNTER — Other Ambulatory Visit (HOSPITAL_COMMUNITY): Payer: Self-pay

## 2024-10-08 NOTE — Telephone Encounter (Signed)
 Pharmacy Patient Advocate Encounter  Received notification from [facility] that Prior Authorization for Ozempic  2 has been APPROVED from 10/07/24 to 10/23/25. Ran test claim, Copay is $45.00. This test claim was processed through [facility]- copay amounts may vary at other pharmacies due to pharmacy/plan contracts, or as the patient moves through the different stages of their insurance plan.   PA #/Case ID/Reference #: # [id number]

## 2024-10-08 NOTE — Telephone Encounter (Signed)
 My chart message sent

## 2024-10-12 ENCOUNTER — Other Ambulatory Visit: Payer: Self-pay | Admitting: Family Medicine

## 2024-10-12 DIAGNOSIS — I10 Essential (primary) hypertension: Secondary | ICD-10-CM

## 2024-10-12 DIAGNOSIS — G4700 Insomnia, unspecified: Secondary | ICD-10-CM

## 2024-11-12 ENCOUNTER — Ambulatory Visit: Admitting: Family Medicine

## 2024-11-12 ENCOUNTER — Encounter: Payer: Self-pay | Admitting: Family Medicine

## 2024-11-12 VITALS — BP 130/72 | HR 106 | Temp 98.1°F | Ht 66.69 in | Wt 231.4 lb

## 2024-11-12 DIAGNOSIS — Z23 Encounter for immunization: Secondary | ICD-10-CM | POA: Diagnosis not present

## 2024-11-12 DIAGNOSIS — G720 Drug-induced myopathy: Secondary | ICD-10-CM | POA: Diagnosis not present

## 2024-11-12 DIAGNOSIS — M25552 Pain in left hip: Secondary | ICD-10-CM | POA: Diagnosis not present

## 2024-11-12 DIAGNOSIS — E1149 Type 2 diabetes mellitus with other diabetic neurological complication: Secondary | ICD-10-CM

## 2024-11-12 DIAGNOSIS — I10 Essential (primary) hypertension: Secondary | ICD-10-CM

## 2024-11-12 LAB — CBC WITH DIFFERENTIAL/PLATELET
Basophils Absolute: 0 K/uL (ref 0.0–0.1)
Basophils Relative: 0.7 % (ref 0.0–3.0)
Eosinophils Absolute: 0.1 K/uL (ref 0.0–0.7)
Eosinophils Relative: 1 % (ref 0.0–5.0)
HCT: 39.5 % (ref 39.0–52.0)
Hemoglobin: 13.5 g/dL (ref 13.0–17.0)
Lymphocytes Relative: 14.2 % (ref 12.0–46.0)
Lymphs Abs: 1 K/uL (ref 0.7–4.0)
MCHC: 34.2 g/dL (ref 30.0–36.0)
MCV: 85.9 fl (ref 78.0–100.0)
Monocytes Absolute: 0.4 K/uL (ref 0.1–1.0)
Monocytes Relative: 6.1 % (ref 3.0–12.0)
Neutro Abs: 5.3 K/uL (ref 1.4–7.7)
Neutrophils Relative %: 78 % — ABNORMAL HIGH (ref 43.0–77.0)
Platelets: 137 K/uL — ABNORMAL LOW (ref 150.0–400.0)
RBC: 4.59 Mil/uL (ref 4.22–5.81)
RDW: 14.7 % (ref 11.5–15.5)
WBC: 6.8 K/uL (ref 4.0–10.5)

## 2024-11-12 LAB — COMPREHENSIVE METABOLIC PANEL WITH GFR
ALT: 33 U/L (ref 3–53)
AST: 32 U/L (ref 5–37)
Albumin: 4.6 g/dL (ref 3.5–5.2)
Alkaline Phosphatase: 92 U/L (ref 39–117)
BUN: 42 mg/dL — ABNORMAL HIGH (ref 6–23)
CO2: 24 meq/L (ref 19–32)
Calcium: 10.3 mg/dL (ref 8.4–10.5)
Chloride: 107 meq/L (ref 96–112)
Creatinine, Ser: 1.53 mg/dL — ABNORMAL HIGH (ref 0.40–1.50)
GFR: 47.56 mL/min — ABNORMAL LOW
Glucose, Bld: 174 mg/dL — ABNORMAL HIGH (ref 70–99)
Potassium: 4.6 meq/L (ref 3.5–5.1)
Sodium: 139 meq/L (ref 135–145)
Total Bilirubin: 0.3 mg/dL (ref 0.2–1.2)
Total Protein: 8.1 g/dL (ref 6.0–8.3)

## 2024-11-12 LAB — MICROALBUMIN / CREATININE URINE RATIO
Creatinine,U: 83.1 mg/dL
Microalb Creat Ratio: 13.4 mg/g (ref 0.0–30.0)
Microalb, Ur: 1.1 mg/dL (ref 0.7–1.9)

## 2024-11-12 LAB — LIPID PANEL
Cholesterol: 160 mg/dL (ref 28–200)
HDL: 31.4 mg/dL — ABNORMAL LOW
LDL Cholesterol: 75 mg/dL (ref 10–99)
NonHDL: 128.26
Total CHOL/HDL Ratio: 5
Triglycerides: 268 mg/dL — ABNORMAL HIGH (ref 10.0–149.0)
VLDL: 53.6 mg/dL — ABNORMAL HIGH (ref 0.0–40.0)

## 2024-11-12 LAB — HEMOGLOBIN A1C: Hgb A1c MFr Bld: 7 % — ABNORMAL HIGH (ref 4.6–6.5)

## 2024-11-12 NOTE — Progress Notes (Unsigned)
 L hip and R shoulder pain, with plan for possible surgery.  Discussed preop considerations for left total hip arthroplasty.  See scanned form.  Diabetes:  Using medications without difficulties: yes Hypoglycemic episodes: no Hyperglycemic episodes: no Feet problems: stable pain and tingling.   Blood Sugars averaging: 130-150s eye exam within last year: due, d/w pt.   Statin intolerant due to aches.   Hypertension:    Using medication without problems or lightheadedness: yes Chest pain with exertion:no Edema:no Short of breath:no  His mobility limitation is orthopedic, not from CV source.   No h/o CVA/MI/CHF.    PMH and SH reviewed  Meds, vitals, and allergies reviewed.   ROS: Per HPI unless specifically indicated in ROS section   GEN: nad, alert and oriented HEENT: mucous membranes moist NECK: supple w/o LA CV: rrr. PULM: ctab, no inc wob ABD: soft, +bs EXT: no edema SKIN: no acute rash  Diabetic foot exam: Normal inspection No skin breakdown No calluses  Normal DP pulses Normal sensation to light touch and monofilament Nails normal  EKG with mild sinus tachycardia but otherwise normal.  Discussed with patient at office visit.

## 2024-11-12 NOTE — Patient Instructions (Signed)
 Ask the front about the next eye exam clinic here.  Go to the lab on the way out.   If you have mychart we'll likely use that to update you.    Take care.  Glad to see you.

## 2024-11-13 ENCOUNTER — Ambulatory Visit: Payer: Self-pay | Admitting: Family Medicine

## 2024-11-13 DIAGNOSIS — M25552 Pain in left hip: Secondary | ICD-10-CM | POA: Insufficient documentation

## 2024-11-13 DIAGNOSIS — R7989 Other specified abnormal findings of blood chemistry: Secondary | ICD-10-CM

## 2024-11-13 NOTE — Assessment & Plan Note (Signed)
 See notes on labs.  Continue amitriptyline  and gabapentin .  Continue lisinopril  metformin  and Ozempic .  See notes on labs.  Assuming his labs are reassuring then he should be appropriately low risk for surgery.

## 2024-11-13 NOTE — Assessment & Plan Note (Signed)
 We discussed that we cannot make his risk for surgery zero but he has a compelling reason for surgery and in the absence of new symptoms or significant lab abnormalities it would appear reasonable to proceed.  See notes on labs.

## 2024-11-13 NOTE — Assessment & Plan Note (Addendum)
 EKG with mild sinus tachycardia.  I asked him to check his pulse out of clinic and let me know if it is persistently elevated.  Assuming his labs are reasonable then it would be appear that he is appropriately low risk for surgery.  Continue lisinopril  in the meantime.

## 2024-11-13 NOTE — Assessment & Plan Note (Signed)
 Statin intolerant

## 2024-11-15 ENCOUNTER — Other Ambulatory Visit

## 2024-11-15 DIAGNOSIS — R7989 Other specified abnormal findings of blood chemistry: Secondary | ICD-10-CM

## 2024-11-15 LAB — BASIC METABOLIC PANEL WITH GFR
BUN: 39 mg/dL — ABNORMAL HIGH (ref 6–23)
CO2: 22 meq/L (ref 19–32)
Calcium: 9.4 mg/dL (ref 8.4–10.5)
Chloride: 103 meq/L (ref 96–112)
Creatinine, Ser: 1.39 mg/dL (ref 0.40–1.50)
GFR: 53.36 mL/min — ABNORMAL LOW
Glucose, Bld: 111 mg/dL — ABNORMAL HIGH (ref 70–99)
Potassium: 5.1 meq/L (ref 3.5–5.1)
Sodium: 132 meq/L — ABNORMAL LOW (ref 135–145)

## 2024-11-21 ENCOUNTER — Ambulatory Visit: Payer: Self-pay | Admitting: Family Medicine

## 2024-11-21 DIAGNOSIS — R7989 Other specified abnormal findings of blood chemistry: Secondary | ICD-10-CM

## 2024-11-22 NOTE — Telephone Encounter (Unsigned)
 Copied from CRM #[id number]. Topic: Clinical - Lab/Test Results >> Nov 22, 2024  2:58 PM [name] wrote: Reason for CRM: The patient has been notified of his lab results. He wants to let Dr. Cleatus know that he did take ibuprofen /aleve a week before his labs, and the most recent time he took them was today.  Please call patient at [phone].

## 2024-11-25 NOTE — Telephone Encounter (Unsigned)
 Copied from CRM #[id number]. Topic: Clinical - Lab/Test Results >> Nov 22, 2024  2:58 PM [name] wrote: Reason for CRM: The patient has been notified of his lab results. He wants to let Dr. Cleatus know that he did take ibuprofen jeronimo a week before his labs, and the most recent time he took them was today.  Please call patient at [phone]. >> Nov 25, 2024 10:48 AM [name] wrote: Patient had received a call back from the clinic but instructions were vague in the voicemail that was left. The patient is needing a callback for more clarification.  Please call the patient at [phone].

## 2024-11-25 NOTE — Addendum Note (Signed)
 Addended by: CLEATUS LORELI RAMAN on: 11/25/2024 07:51 PM   Modules accepted: Orders

## 2024-12-02 ENCOUNTER — Other Ambulatory Visit
